# Patient Record
Sex: Female | Born: 1980 | Race: White | Hispanic: No | Marital: Married | Smoking: Current every day smoker
Health system: Southern US, Community
[De-identification: ages and names within clinical notes are randomized; demographics above are authoritative.]

## PROBLEM LIST (undated history)

## (undated) DIAGNOSIS — I471 Supraventricular tachycardia, unspecified: Secondary | ICD-10-CM

## (undated) DIAGNOSIS — R109 Unspecified abdominal pain: Secondary | ICD-10-CM

## (undated) DIAGNOSIS — Z87442 Personal history of urinary calculi: Secondary | ICD-10-CM

## (undated) DIAGNOSIS — N289 Disorder of kidney and ureter, unspecified: Secondary | ICD-10-CM

## (undated) DIAGNOSIS — N200 Calculus of kidney: Secondary | ICD-10-CM

## (undated) HISTORY — PX: OTHER SURGICAL HISTORY: SHX169

---

## 2008-06-24 ENCOUNTER — Emergency Department: Payer: Self-pay | Admitting: Emergency Medicine

## 2008-08-10 ENCOUNTER — Ambulatory Visit (HOSPITAL_COMMUNITY): Admission: RE | Admit: 2008-08-10 | Discharge: 2008-08-10 | Payer: Self-pay | Admitting: Obstetrics & Gynecology

## 2008-08-10 ENCOUNTER — Ambulatory Visit: Payer: Self-pay | Admitting: Family Medicine

## 2008-08-10 LAB — CONVERTED CEMR LAB
Antibody Screen: NEGATIVE
Basophils Relative: 0 % (ref 0–1)
Eosinophils Absolute: 0.1 10*3/uL (ref 0.0–0.7)
Hemoglobin: 13 g/dL (ref 12.0–15.0)
Lymphocytes Relative: 20 % (ref 12–46)
Lymphs Abs: 1.6 10*3/uL (ref 0.7–4.0)
Neutro Abs: 5.8 10*3/uL (ref 1.7–7.7)
Platelets: 250 10*3/uL (ref 150–400)
RBC: 4.54 M/uL (ref 3.87–5.11)
Rh Type: NEGATIVE
Rubella: >500 intl units/mL — ABNORMAL HIGH

## 2008-08-24 ENCOUNTER — Ambulatory Visit: Payer: Self-pay | Admitting: Obstetrics & Gynecology

## 2008-08-24 ENCOUNTER — Encounter: Payer: Self-pay | Admitting: Family Medicine

## 2008-08-26 ENCOUNTER — Ambulatory Visit: Payer: Self-pay | Admitting: Family Medicine

## 2008-08-26 LAB — CONVERTED CEMR LAB: hCG, Beta Chain, Quant, S: 173.7 milliintl units/mL

## 2008-09-30 ENCOUNTER — Ambulatory Visit: Payer: Self-pay | Admitting: Obstetrics & Gynecology

## 2008-10-01 ENCOUNTER — Encounter: Payer: Self-pay | Admitting: Family Medicine

## 2009-01-25 ENCOUNTER — Ambulatory Visit (HOSPITAL_COMMUNITY): Admission: RE | Admit: 2009-01-25 | Discharge: 2009-01-25 | Payer: Self-pay | Admitting: Urology

## 2009-02-16 ENCOUNTER — Ambulatory Visit (HOSPITAL_COMMUNITY): Admission: RE | Admit: 2009-02-16 | Discharge: 2009-02-16 | Payer: Self-pay | Admitting: Urology

## 2009-04-06 ENCOUNTER — Emergency Department: Payer: Self-pay | Admitting: Internal Medicine

## 2009-05-01 HISTORY — PX: EXTRACORPOREAL SHOCK WAVE LITHOTRIPSY: SHX1557

## 2009-06-10 ENCOUNTER — Emergency Department: Payer: Self-pay | Admitting: Internal Medicine

## 2009-06-21 ENCOUNTER — Emergency Department (HOSPITAL_COMMUNITY): Admission: EM | Admit: 2009-06-21 | Discharge: 2009-06-21 | Payer: Self-pay | Admitting: Emergency Medicine

## 2009-07-14 ENCOUNTER — Emergency Department (HOSPITAL_COMMUNITY): Admission: EM | Admit: 2009-07-14 | Discharge: 2009-07-14 | Payer: Self-pay | Admitting: Emergency Medicine

## 2009-07-16 ENCOUNTER — Emergency Department: Payer: Self-pay | Admitting: Emergency Medicine

## 2009-11-29 ENCOUNTER — Emergency Department: Payer: Self-pay | Admitting: Emergency Medicine

## 2010-01-02 ENCOUNTER — Emergency Department (HOSPITAL_COMMUNITY): Admission: EM | Admit: 2010-01-02 | Discharge: 2010-01-02 | Payer: Self-pay | Admitting: Emergency Medicine

## 2010-07-14 LAB — BASIC METABOLIC PANEL WITH GFR
BUN: 7 mg/dL (ref 6–23)
Creatinine, Ser: 0.68 mg/dL (ref 0.4–1.2)
GFR calc non Af Amer: >60 mL/min (ref >60)

## 2010-07-14 LAB — BASIC METABOLIC PANEL
CO2: 26 mEq/L (ref 19–32)
Calcium: 8.9 mg/dL (ref 8.4–10.5)
Chloride: 103 mEq/L (ref 96–112)
GFR calc Af Amer: >60 mL/min (ref >60)
Glucose, Bld: 81 mg/dL (ref 70–99)
Potassium: 3.7 mEq/L (ref 3.5–5.1)
Sodium: 137 mEq/L (ref 135–145)

## 2010-07-14 LAB — CBC
HCT: 40.5 % (ref 36.0–46.0)
Hemoglobin: 14 g/dL (ref 12.0–15.0)
MCH: 29.4 pg (ref 26.0–34.0)
MCHC: 34.5 g/dL (ref 30.0–36.0)
MCV: 85.4 fL (ref 78.0–100.0)
Platelets: 161 10*3/uL (ref 150–400)
RBC: 4.75 MIL/uL (ref 3.87–5.11)
RDW: 12.6 % (ref 11.5–15.5)
WBC: 8.9 K/uL (ref 4.0–10.5)

## 2010-07-14 LAB — URINALYSIS, ROUTINE W REFLEX MICROSCOPIC
Glucose, UA: NEGATIVE mg/dL
Hgb urine dipstick: NEGATIVE
Ketones, ur: 15 mg/dL — AB
Nitrite: NEGATIVE
Protein, ur: NEGATIVE mg/dL
Specific Gravity, Urine: >1.03 — ABNORMAL HIGH (ref 1.005–1.030)
Urobilinogen, UA: >8 mg/dL — ABNORMAL HIGH (ref 0.0–1.0)
pH: 6 (ref 5.0–8.0)

## 2010-07-14 LAB — PREGNANCY, URINE: Preg Test, Ur: NEGATIVE

## 2010-07-14 LAB — DIFFERENTIAL
Basophils Absolute: 0 K/uL (ref 0.0–0.1)
Basophils Relative: 0 % (ref 0–1)
Eosinophils Absolute: 0 10*3/uL (ref 0.0–0.7)
Eosinophils Relative: 0 % (ref 0–5)
Lymphocytes Relative: 10 % — ABNORMAL LOW (ref 12–46)
Lymphs Abs: 0.9 10*3/uL (ref 0.7–4.0)
Monocytes Absolute: 0.8 K/uL (ref 0.1–1.0)
Monocytes Relative: 9 % (ref 3–12)
Neutro Abs: 7.2 K/uL (ref 1.7–7.7)
Neutrophils Relative %: 80 % — ABNORMAL HIGH (ref 43–77)

## 2010-07-14 LAB — RAPID STREP SCREEN (MED CTR MEBANE ONLY): Streptococcus, Group A Screen (Direct): NEGATIVE

## 2010-07-20 LAB — DIFFERENTIAL
Basophils Absolute: 0.1 10*3/uL (ref 0.0–0.1)
Eosinophils Absolute: 0.1 10*3/uL (ref 0.0–0.7)
Eosinophils Relative: 2 % (ref 0–5)
Lymphocytes Relative: 36 % (ref 12–46)
Monocytes Absolute: 0.5 10*3/uL (ref 0.1–1.0)
Neutro Abs: 4.9 10*3/uL (ref 1.7–7.7)
Neutrophils Relative %: 56 % (ref 43–77)

## 2010-07-20 LAB — BASIC METABOLIC PANEL
Chloride: 101 mEq/L (ref 96–112)
GFR calc non Af Amer: >60 mL/min (ref >60)
Potassium: 3 mEq/L — ABNORMAL LOW (ref 3.5–5.1)
Sodium: 133 mEq/L — ABNORMAL LOW (ref 135–145)

## 2010-07-20 LAB — POCT CARDIAC MARKERS
CKMB, poc: <1 ng/mL — ABNORMAL LOW (ref 1.0–8.0)
Troponin i, poc: <0.05 ng/mL (ref 0.00–0.09)

## 2010-07-20 LAB — D-DIMER, QUANTITATIVE: D-Dimer, Quant: 0.25 ug/mL-FEU (ref 0.00–0.48)

## 2010-07-20 LAB — CBC
HCT: 39.9 % (ref 36.0–46.0)
Hemoglobin: 13.7 g/dL (ref 12.0–15.0)
MCHC: 34.3 g/dL (ref 30.0–36.0)
MCV: 85.5 fL (ref 78.0–100.0)
Platelets: 234 10*3/uL (ref 150–400)

## 2010-07-25 LAB — URINALYSIS, ROUTINE W REFLEX MICROSCOPIC
Protein, ur: 30 mg/dL — AB
Specific Gravity, Urine: 1.015 (ref 1.005–1.030)
pH: 8 (ref 5.0–8.0)

## 2010-07-25 LAB — URINE CULTURE: Colony Count: NO GROWTH

## 2010-07-25 LAB — PREGNANCY, URINE: Preg Test, Ur: NEGATIVE

## 2010-09-13 NOTE — Assessment & Plan Note (Signed)
Jeanne David, BERKERY NO.:  [id number]   MEDICAL RECORD NO.:  [id number]          PATIENT TYPE:  POB   LOCATION:  [facility] at [facility]         FACILITY:  [facility]   PHYSICIAN:  Scheryl Darter, MD       DATE OF BIRTH:  [birth year]   DATE OF SERVICE:                                  CLINIC NOTE   The patient returns in followup after having a elective abortion on  April 15.  She received RhoGAM due to large negative status.  The  patient is a 30 year old gravida 3, para 2, abortus 1, last menstrual  period May 28.  For nearly 2 weeks, she has had symptoms of urinary  frequency and pressure.  She states she wants to conceive.  She has a  sample for Loestrin 24 which was given to her after her procedure.  She  breakthrough bleeding on the current pack.  Advised her that she wants  to conceive and then she should not take the birth control pills.  I  have recommended she take a prenatal vitamin or an adult multivitamin.  Urinalysis is consistent with UTI.  Gave prescription for Bactrim DS 10  tablets 1 p.o. b.i.d.  She will report for symptoms that improve.  Recommend that she return for routine gynecologic exam in 3-4 months.  She will notify us if she conceives however.      Scheryl Darter, MD     JA/MEDQ  D:  09/30/2008  T:  09/30/2008  Job:  [id number]

## 2011-02-27 ENCOUNTER — Emergency Department: Payer: Self-pay | Admitting: Emergency Medicine

## 2014-05-01 DIAGNOSIS — I471 Supraventricular tachycardia: Secondary | ICD-10-CM

## 2014-05-01 HISTORY — DX: Supraventricular tachycardia: I47.1

## 2014-07-20 ENCOUNTER — Emergency Department: Payer: Self-pay | Admitting: Emergency Medicine

## 2016-08-19 ENCOUNTER — Other Ambulatory Visit: Payer: Self-pay

## 2016-08-19 ENCOUNTER — Emergency Department: Payer: Self-pay

## 2016-08-19 ENCOUNTER — Emergency Department
Admission: EM | Admit: 2016-08-19 | Discharge: 2016-08-19 | Disposition: A | Discharge status: Home / Self Care | Payer: Self-pay | Attending: Emergency Medicine | Admitting: Emergency Medicine

## 2016-08-19 ENCOUNTER — Encounter: Payer: Self-pay | Admitting: Emergency Medicine

## 2016-08-19 DIAGNOSIS — R079 Chest pain, unspecified: Secondary | ICD-10-CM

## 2016-08-19 DIAGNOSIS — I471 Supraventricular tachycardia: Secondary | ICD-10-CM | POA: Insufficient documentation

## 2016-08-19 DIAGNOSIS — F17200 Nicotine dependence, unspecified, uncomplicated: Secondary | ICD-10-CM | POA: Insufficient documentation

## 2016-08-19 LAB — CBC WITH DIFFERENTIAL/PLATELET
Basophils Absolute: 0.1 10*3/uL (ref 0–0.1)
Basophils Relative: 1 %
Eosinophils Absolute: 0.4 10*3/uL (ref 0–0.7)
Eosinophils Relative: 4 %
HEMATOCRIT: 45.3 % (ref 35.0–47.0)
HEMOGLOBIN: 15.5 g/dL (ref 12.0–16.0)
LYMPHS ABS: 3.6 10*3/uL (ref 1.0–3.6)
LYMPHS PCT: 34 %
MCH: 29 pg (ref 26.0–34.0)
MCHC: 34.3 g/dL (ref 32.0–36.0)
MCV: 84.6 fL (ref 80.0–100.0)
MONO ABS: 0.6 10*3/uL (ref 0.2–0.9)
Monocytes Relative: 6 %
NEUTROS ABS: 6 10*3/uL (ref 1.4–6.5)
NEUTROS PCT: 55 %
Platelets: 341 10*3/uL (ref 150–440)
RBC: 5.36 MIL/uL — ABNORMAL HIGH (ref 3.80–5.20)
RDW: 12.5 % (ref 11.5–14.5)
WBC: 10.7 10*3/uL (ref 3.6–11.0)

## 2016-08-19 LAB — BASIC METABOLIC PANEL
Anion gap: 8 (ref 5–15)
BUN: 9 mg/dL (ref 6–20)
CHLORIDE: 109 mmol/L (ref 101–111)
CO2: 25 mmol/L (ref 22–32)
CREATININE: 0.64 mg/dL (ref 0.44–1.00)
Calcium: 8.8 mg/dL — ABNORMAL LOW (ref 8.9–10.3)
GFR calc non Af Amer: >60 mL/min (ref >60)
GLUCOSE: 111 mg/dL — AB (ref 65–99)
Potassium: 3.3 mmol/L — ABNORMAL LOW (ref 3.5–5.1)
Sodium: 142 mmol/L (ref 135–145)

## 2016-08-19 LAB — TROPONIN I: Troponin I: <0.03 ng/mL (ref <0.03)

## 2016-08-19 LAB — ETHANOL: Alcohol, Ethyl (B): 160 mg/dL — ABNORMAL HIGH (ref <5)

## 2016-08-19 MED ORDER — ADENOSINE 6 MG/2ML IV SOLN
6.0000 mg | Freq: Once | INTRAVENOUS | Status: AC
  Administered 2016-08-19: 6 mg via INTRAVENOUS

## 2016-08-19 MED ORDER — DILTIAZEM HCL 100 MG IV SOLR
5.0000 mg/h | Freq: Once | INTRAVENOUS | Status: AC
  Administered 2016-08-19: 5 mg/h via INTRAVENOUS
  Filled 2016-08-19: qty 100

## 2016-08-19 MED ORDER — DILTIAZEM HCL 25 MG/5ML IV SOLN
10.0000 mg | Freq: Once | INTRAVENOUS | Status: AC
  Administered 2016-08-19: 10 mg via INTRAVENOUS

## 2016-08-19 MED ORDER — ADENOSINE 12 MG/4ML IV SOLN
12.0000 mg | Freq: Once | INTRAVENOUS | Status: AC
  Administered 2016-08-19: 12 mg via INTRAVENOUS

## 2016-08-19 MED ORDER — ONDANSETRON HCL 4 MG/2ML IJ SOLN
INTRAMUSCULAR | Status: AC
  Filled 2016-08-19: qty 2

## 2016-08-19 MED ORDER — SODIUM CHLORIDE 0.9 % IV BOLUS (SEPSIS)
1000.0000 mL | Freq: Once | INTRAVENOUS | Status: AC
  Administered 2016-08-19: 1000 mL via INTRAVENOUS

## 2016-08-19 NOTE — ED Notes (Signed)
Pt assisted up to commode. Pt states "I just want to go home, I mean that's a risk I have to take." pt then states "Im gonna give it another hour but this medicine is not working."

## 2016-08-19 NOTE — ED Notes (Addendum)
Pt converted HR 101. MD Manson Passey at bedside

## 2016-08-19 NOTE — ED Notes (Signed)
Pt offered wheelchair upon registration. Pt refused chair. MD Manson Passey aware.

## 2016-08-19 NOTE — ED Notes (Signed)
Dr. Manson Passey in to speak with pt regarding continued tachycardia treatment.

## 2016-08-19 NOTE — ED Notes (Signed)
Pt throwing up, HR incr to 190.  MD Manson Passey informed.  4 mg Zofran ordered.  Pt refused zofran.  Pts HR decr 106

## 2016-08-19 NOTE — ED Notes (Signed)
Pt assisted up to void. Pt declines offer for lights out and head of bed reclined. Additional warm blankets provided.

## 2016-08-19 NOTE — ED Notes (Signed)
Report to nellie, rn for lunch relief.

## 2016-08-19 NOTE — ED Notes (Signed)
Dr. Manson Passey in to speak with pt again.

## 2016-08-19 NOTE — ED Notes (Signed)
Pt states tachycardia that began at 1900 yesterday accompanied by left neck pain and chest pain. etoh odor noted. Pt states she tried to bear down and control rate at home without success. Pt is very anxious and irritated at questions asked by md. Pt repeatedly states to md "that's not gonna work" with each treatment discussed. Skin pwd.

## 2016-08-19 NOTE — ED Triage Notes (Signed)
Pt arrives ambulatory to triage with c/o SVT. Pt taken to room 5. Pt has hx of SVT.

## 2016-08-19 NOTE — ED Provider Notes (Signed)
[facility] Emergency Department Provider Note    First MD Initiated Contact with Patient 08/19/16 0141     (approximate)  I have reviewed the triage vital signs and the nursing notes.   HISTORY  Chief Complaint Tachycardia    HPI Jeanne David is a 36 y.o. female  with history of SVT presents to the emergency department stating that she's been in SVT since 7 PM. Patient denies any pain at this time but does admit to intermittent dizziness. Patient denies any nausea vomiting. Patient denies any dyspnea. Patient states that she's had episodes of SVT since childhood that she has tried multiple maneuvers since 7:00 and an attempt to stop the arrhythmia however has been unable to do so. Patient states that she was referred to cardiology however due to lack of radical insurance has been unable to have any intervention performed. Patient states that she has episodes of SVT daily over most episodes of brief.  Past medical history Superventricular tachycardia There are no active problems to display for this patient.   Past surgical history None  Prior to Admission medications   Not on File    Allergies No known drug allergies No family history on file.  Social History Social History  Substance Use Topics  . Smoking status: Current Every Day Smoker  . Smokeless tobacco: Never Used  . Alcohol use No    Review of Systems Constitutional: No fever/chills Eyes: No visual changes. ENT: No sore throat. Cardiovascular: Denies chest pain.Positive for rapid heart palpitations. Respiratory: Denies shortness of breath. Gastrointestinal: No abdominal pain.  No nausea, no vomiting.  No diarrhea.  No constipation. Genitourinary: Negative for dysuria. Musculoskeletal: Negative for back pain. Skin: Negative for rash. Neurological: Negative for headaches, focal weakness or numbness.  10-point ROS otherwise  negative.  ____________________________________________   PHYSICAL EXAM:  VITAL SIGNS: ED Triage Vitals  Enc Vitals Group     BP 08/19/16 0122 (!) 136/92     Pulse Rate 08/19/16 0122 (!) 211     Resp 08/19/16 0122 (!) 24     Temp --      Temp src --      SpO2 08/19/16 0122 100 %     Weight 08/19/16 0119 130 lb (59 kg)     Height 08/19/16 0119  (1.6 m)     Head Circumference --      Peak Flow --      Pain Score 08/19/16 0119 0     Pain Loc --      Pain Edu? --      Excl. in GC? --     Constitutional: Alert and oriented. Appears to be irate. EtOH on breath Eyes: Conjunctivae are normal. PERRL. EOMI. Head: Atraumatic. Mouth/Throat: Mucous membranes are moist. Oropharynx non-erythematous. Neck: No stridor.   Cardiovascular: Tachycardia, regular rhythm. Good peripheral circulation. Grossly normal heart sounds. Respiratory: Normal respiratory effort.  No retractions. Lungs CTAB. Gastrointestinal: Soft and nontender. No distention.  Musculoskeletal: No lower extremity tenderness nor edema. No gross deformities of extremities. Neurologic:  Normal speech and language. No gross focal neurologic deficits are appreciated.  Skin:  Skin is warm, dry and intact. No rash noted. Psychiatric: Mood and affect are normal. Speech and behavior are normal.  ____________________________________________   LABS (all labs ordered are listed, but only abnormal results are displayed)  Labs Reviewed  CBC WITH DIFFERENTIAL/PLATELET - Abnormal; Notable for the following:       Result Value   RBC  5.36 (*)    All other components within normal limits  BASIC METABOLIC PANEL - Abnormal; Notable for the following:    Potassium 3.3 (*)    Glucose, Bld 111 (*)    Calcium 8.8 (*)    All other components within normal limits  ETHANOL - Abnormal; Notable for the following:    Alcohol, Ethyl (B) 160 (*)    All other components within normal limits  TROPONIN I    ____________________________________________  EKG   ED ECG REPORT I, [place] N BROWN, the attending physician, personally viewed and interpreted this ECG.   Date: 08/19/2016  EKG Time: 1:16 AM  Rate: 202  Rhythm: Supraventricular tachycardia  Axis: Normal  Intervals: Normal  ST&T Change: None  ____________________________________________  RADIOLOGY I, [place] N BROWN, personally viewed and evaluated these images (plain radiographs) as part of my medical decision making, as well as reviewing the written report by the radiologist.  Dg Chest Port 1 View  Result Date: 08/19/2016 CLINICAL DATA:  Chest pain this morning. EXAM: PORTABLE CHEST 1 VIEW COMPARISON:  07/20/2014 FINDINGS: The heart size and mediastinal contours are within normal limits. Both lungs are clear. The visualized skeletal structures are unremarkable. IMPRESSION: No active disease. Electronically Signed   By: Burman Nieves M.D.   On: 08/19/2016 02:13    ____________________________________________   PROCEDURES  Critical Care performed: CRITICAL CARE Performed by: Darci Current   Total critical care time: 90 minutes  Critical care time was exclusive of separately billable procedures and treating other patients.  Critical care was necessary to treat or prevent imminent or life-threatening deterioration.  Critical care was time spent personally by me on the following activities: development of treatment plan with patient and/or surrogate as well as nursing, discussions with consultants, evaluation of patient's response to treatment, examination of patient, obtaining history from patient or surrogate, ordering and performing treatments and interventions, ordering and review of laboratory studies, ordering and review of radiographic studies, pulse oximetry and re-evaluation of patient's condition.     Procedures   ____________________________________________   INITIAL IMPRESSION / ASSESSMENT AND  PLAN / ED COURSE  Pertinent labs & imaging results that were available during my care of the patient were reviewed by me and considered in my medical decision making (see chart for details).  36 year old female presenting in suprapubic ventricular tachycardia. Patient irate on arrival to the room but informed me that this was secondary to frustration secondary to her medical illness. Patient told me on arrival that she will not allow herself to be admitted to the hospital. Patient also informed me that adenosine has never worked for her in the past. I spoke with the patient at length regarding management of SVT.  Patient received 6 mg and subsequently 12 mg of adenosine with brief resolution of SVT but then spontaneous return back to SVT. As such patient given Cardizem 10 mg bolus again with brief interruption of SVT. Patient placed on a Cardizem drip. Patient adamantly refusing to be admitted. As such patient maintained in the emergency department.  Following receiving the patient's alcohol level she admits to drinking EtOH daily.  Patient's SVT resolved at at 4:00 AM. Patient requesting discharge at this time   Patient referred to Dr. Elberta Fortis (electrophysiologist cardiology) ____________________________________________  FINAL CLINICAL IMPRESSION(S) / ED DIAGNOSES  Final diagnoses:  SVT (supraventricular tachycardia) (HCC)     MEDICATIONS GIVEN DURING THIS VISIT:  Medications  adenosine (ADENOCARD) 6 MG/2ML injection 6 mg (6 mg Intravenous Given 08/19/16 0124)  adenosine (ADENOCARD) 12 MG/4ML injection 12 mg (12 mg Intravenous Given 08/19/16 0129)  diltiazem (CARDIZEM) injection 10 mg (10 mg Intravenous Given 08/19/16 0135)  diltiazem (CARDIZEM) 100 mg in dextrose 5 % 100 mL (1 mg/mL) infusion (15 mg/hr Intravenous Rate/Dose Change 08/19/16 0335)  adenosine (ADENOCARD) 6 MG/2ML injection 6 mg (6 mg Intravenous Given 08/19/16 0124)  adenosine (ADENOCARD) 12 MG/4ML injection 12 mg (12 mg  Intravenous Given 08/19/16 0130)  sodium chloride 0.9 % bolus 1,000 mL (0 mLs Intravenous Stopped 08/19/16 0225)  sodium chloride 0.9 % bolus 1,000 mL (0 mLs Intravenous Stopped 08/19/16 0232)     NEW OUTPATIENT MEDICATIONS STARTED DURING THIS VISIT:  New Prescriptions   No medications on file    Modified Medications   No medications on file    Discontinued Medications   No medications on file     Note:  This document was prepared using Dragon voice recognition software and may include unintentional dictation errors.    Darci Current, MD 08/19/16 [phone]

## 2017-09-26 ENCOUNTER — Telehealth: Payer: Self-pay | Admitting: Licensed Clinical Social Worker

## 2017-09-26 NOTE — Telephone Encounter (Signed)
Dhanvi Boesen/Counselor called home and voicemail was full-unable to leave a message. Also, called mobile phone which rang busy. This is a follow up call regarding the referral by Dr. Oswaldo Done: Worsening anxiety and mood problems over the last few months.  Started on Lexapro in the past.  Increase to 20 mg today.

## 2018-04-22 ENCOUNTER — Other Ambulatory Visit: Payer: Self-pay

## 2018-04-22 ENCOUNTER — Emergency Department
Admission: EM | Admit: 2018-04-22 | Discharge: 2018-04-22 | Disposition: A | Discharge status: Home / Self Care | Payer: Self-pay | Attending: Emergency Medicine | Admitting: Emergency Medicine

## 2018-04-22 DIAGNOSIS — R112 Nausea with vomiting, unspecified: Secondary | ICD-10-CM | POA: Insufficient documentation

## 2018-04-22 DIAGNOSIS — M7918 Myalgia, other site: Secondary | ICD-10-CM | POA: Insufficient documentation

## 2018-04-22 DIAGNOSIS — I471 Supraventricular tachycardia: Secondary | ICD-10-CM | POA: Insufficient documentation

## 2018-04-22 DIAGNOSIS — J111 Influenza due to unidentified influenza virus with other respiratory manifestations: Secondary | ICD-10-CM | POA: Insufficient documentation

## 2018-04-22 DIAGNOSIS — F17200 Nicotine dependence, unspecified, uncomplicated: Secondary | ICD-10-CM | POA: Insufficient documentation

## 2018-04-22 HISTORY — DX: Supraventricular tachycardia: I47.1

## 2018-04-22 HISTORY — DX: Supraventricular tachycardia, unspecified: I47.10

## 2018-04-22 LAB — INFLUENZA PANEL BY PCR (TYPE A & B)
INFLBPCR: NEGATIVE
Influenza A By PCR: POSITIVE — AB

## 2018-04-22 MED ORDER — ONDANSETRON 4 MG PO TBDP
ORAL_TABLET | ORAL | Status: AC
  Filled 2018-04-22: qty 1

## 2018-04-22 MED ORDER — OSELTAMIVIR PHOSPHATE 75 MG PO CAPS: 75.0000 mg | ORAL_CAPSULE | Freq: Two times a day (BID) | ORAL | 0 refills | Status: AC

## 2018-04-22 MED ORDER — ONDANSETRON 4 MG PO TBDP: 4.0000 mg | ORAL_TABLET | Freq: Once | ORAL | Status: DC

## 2018-04-22 MED ORDER — ONDANSETRON HCL 4 MG PO TABS: 4.0000 mg | ORAL_TABLET | Freq: Once | ORAL | Status: DC

## 2018-04-22 MED ORDER — SODIUM CHLORIDE 0.9 % IV BOLUS: 1000.0000 mL | Freq: Once | INTRAVENOUS | Status: DC

## 2018-04-22 MED ORDER — METOPROLOL TARTRATE 25 MG PO TABS: 25.0000 mg | ORAL_TABLET | Freq: Two times a day (BID) | ORAL | 0 refills | Status: DC

## 2018-04-22 MED ORDER — ONDANSETRON HCL 4 MG/2ML IJ SOLN: 4.0000 mg | Freq: Once | INTRAMUSCULAR | Status: DC

## 2018-04-22 MED ORDER — ATENOLOL 25 MG PO TABS
25.0000 mg | ORAL_TABLET | Freq: Once | ORAL | Status: AC
  Administered 2018-04-22: 25 mg via ORAL
  Filled 2018-04-22: qty 1

## 2018-04-22 MED ORDER — OSELTAMIVIR PHOSPHATE 75 MG PO CAPS
75.0000 mg | ORAL_CAPSULE | Freq: Once | ORAL | Status: AC
  Administered 2018-04-22: 75 mg via ORAL
  Filled 2018-04-22: qty 1

## 2018-04-22 NOTE — ED Provider Notes (Signed)
[facility] Emergency Department Provider Note  ___________________________________________   First MD Initiated Contact with Patient 04/22/18 1003     (approximate)  I have reviewed the triage vital signs and the nursing notes.   HISTORY  Chief Complaint Emesis and Fever   HPI Jeanne David is a 37 y.o. female with a history of SVT off of her atenolol who is presenting with 24 hours of fever, body aches as well as cough, nausea and vomiting.  Denies any diarrhea.  Denies any known sick contacts.  Says that she has been off of her atenolol because she does not have a primary care doctor and has no one to refill her medications.  Says she thinks she had an episode of SVT earlier this morning while vomiting but it is since resolved.   Past Medical History:  Diagnosis Date  . SVT (supraventricular tachycardia) (HCC)     There are no active problems to display for this patient.   History reviewed. No pertinent surgical history.  Prior to Admission medications   Not on File    Allergies Patient has no known allergies.  No family history on file.  Social History Social History   Tobacco Use  . Smoking status: Current Every Day Smoker  . Smokeless tobacco: Never Used  Substance Use Topics  . Alcohol use: No  . Drug use: No    Review of Systems  Constitutional: Fever/chills Eyes: No visual changes. ENT: Rhinorrhea Cardiovascular: Denies chest pain. Respiratory: For cough Gastrointestinal: No abdominal pain.   No diarrhea.  No constipation. Genitourinary: Negative for dysuria. Musculoskeletal: Negative for back pain. Skin: Negative for rash. Neurological: Negative for headaches, focal weakness or numbness.   ____________________________________________   PHYSICAL EXAM:  VITAL SIGNS: ED Triage Vitals  Enc Vitals Group     BP 04/22/18 1004 106/74     Pulse Rate 04/22/18 1004 99     Resp 04/22/18 1004 18     Temp 04/22/18  1004 100 F (37.8 C)     Temp Source 04/22/18 1004 Oral     SpO2 04/22/18 1004 99 %     Weight 04/22/18 0958 145 lb (65.8 kg)     Height 04/22/18 0958 5\' 2"  (1.575 m)     Head Circumference --      Peak Flow --      Pain Score 04/22/18 0958 7     Pain Loc --      Pain Edu? --      Excl. in GC? --     Constitutional: Alert and oriented.  Appears ill.  Coughing in the room. Eyes: Conjunctivae are normal.  Head: Atraumatic. Nose: Clear rhinorrhea to the bilateral naris Mouth/Throat: Mucous membranes are moist.  No erythema.  No swelling or exudate. Neck: No stridor.   Cardiovascular: Normal rate, regular rhythm. Grossly normal heart sounds.  With deep breathing the patient's heart rate goes down to 98 bpm.  Patient had a vomiting episode and the heart rate went up to approximate 180 but resolved back to the 90s after several minutes. Respiratory: Normal respiratory effort.  No retractions. Lungs CTAB. Gastrointestinal: Soft and nontender. No distention.  Musculoskeletal: No lower extremity tenderness nor edema.  No joint effusions. Neurologic:  Normal speech and language. No gross focal neurologic deficits are appreciated. Skin:  Skin is warm, dry and intact. No rash noted. Psychiatric: Mood and affect are normal. Speech and behavior are normal.  ____________________________________________   LABS (all labs ordered  are listed, but only abnormal results are displayed)  Labs Reviewed  INFLUENZA PANEL BY PCR (TYPE A & B) - Abnormal; Notable for the following components:      Result Value   Influenza A By PCR POSITIVE (*)    All other components within normal limits   ____________________________________________  EKG    ____________________________________________  RADIOLOGY   ____________________________________________   PROCEDURES  Procedure(s) performed:   Procedures  Critical Care performed:   ____________________________________________   INITIAL  IMPRESSION / ASSESSMENT AND PLAN / ED COURSE  Pertinent labs & imaging results that were available during my care of the patient were reviewed by me and considered in my medical decision making (see chart for details).  DDX: URI, influenza, SVT As part of my medical decision making, I reviewed the following data within the electronic MEDICAL RECORD NUMBER Notes from prior ED visits  ----------------------------------------- 10:58 AM on 04/22/2018 -----------------------------------------  Patient positive for flu A.  She will be discharged with prescriptions for Tamiflu, Zofran as well as atenolol.  Will be given her first dose of each of these medications here in the emergency department.  Will be discharged home.  Given work note as well.  ____________________________________________   FINAL CLINICAL IMPRESSION(S) / ED DIAGNOSES  Influenza.  SVT.  NEW MEDICATIONS STARTED DURING THIS VISIT:  New Prescriptions   No medications on file     Note:  This document was prepared using Dragon voice recognition software and may include unintentional dictation errors.     Myrna BlazerSchaevitz, Detravion Tester Matthew, MD 04/22/18 1059

## 2018-04-22 NOTE — ED Triage Notes (Signed)
Pt c/o bodyaches with temp 102 this morning, N/V.. states she started with a cough yesterday today having the N/V. Denies abd pain or diarrhea.

## 2018-11-14 ENCOUNTER — Other Ambulatory Visit: Payer: Self-pay

## 2018-11-14 ENCOUNTER — Emergency Department
Admission: EM | Admit: 2018-11-14 | Discharge: 2018-11-14 | Disposition: A | Discharge status: Home / Self Care | Payer: Self-pay | Attending: Emergency Medicine | Admitting: Emergency Medicine

## 2018-11-14 ENCOUNTER — Emergency Department: Payer: Self-pay

## 2018-11-14 DIAGNOSIS — N23 Unspecified renal colic: Secondary | ICD-10-CM | POA: Insufficient documentation

## 2018-11-14 DIAGNOSIS — F17200 Nicotine dependence, unspecified, uncomplicated: Secondary | ICD-10-CM | POA: Insufficient documentation

## 2018-11-14 HISTORY — DX: Disorder of kidney and ureter, unspecified: N28.9

## 2018-11-14 LAB — URINALYSIS, COMPLETE (UACMP) WITH MICROSCOPIC
Bacteria, UA: NONE SEEN
Bilirubin Urine: NEGATIVE
Glucose, UA: NEGATIVE mg/dL
Ketones, ur: NEGATIVE mg/dL
Nitrite: NEGATIVE
Protein, ur: 30 mg/dL — AB
RBC / HPF: >50 RBC/hpf — ABNORMAL HIGH (ref 0–5)
Specific Gravity, Urine: 1.021 (ref 1.005–1.030)
pH: 5 (ref 5.0–8.0)

## 2018-11-14 LAB — CBC
HCT: 40.1 % (ref 36.0–46.0)
Hemoglobin: 13.1 g/dL (ref 12.0–15.0)
MCH: 27.9 pg (ref 26.0–34.0)
MCHC: 32.7 g/dL (ref 30.0–36.0)
MCV: 85.3 fL (ref 80.0–100.0)
Platelets: 336 10*3/uL (ref 150–400)
RBC: 4.7 MIL/uL (ref 3.87–5.11)
RDW: 12.5 % (ref 11.5–15.5)
WBC: 9.2 10*3/uL (ref 4.0–10.5)
nRBC: 0 % (ref 0.0–0.2)

## 2018-11-14 LAB — BASIC METABOLIC PANEL
Anion gap: 10 (ref 5–15)
BUN: 10 mg/dL (ref 6–20)
CO2: 23 mmol/L (ref 22–32)
Calcium: 9.1 mg/dL (ref 8.9–10.3)
Chloride: 108 mmol/L (ref 98–111)
Creatinine, Ser: 0.77 mg/dL (ref 0.44–1.00)
GFR calc Af Amer: >60 mL/min (ref >60)
GFR calc non Af Amer: >60 mL/min (ref >60)
Glucose, Bld: 116 mg/dL — ABNORMAL HIGH (ref 70–99)
Potassium: 3.9 mmol/L (ref 3.5–5.1)
Sodium: 141 mmol/L (ref 135–145)

## 2018-11-14 LAB — LIPASE, BLOOD: Lipase: 26 U/L (ref 11–51)

## 2018-11-14 LAB — POC URINE PREG, ED: Preg Test, Ur: NEGATIVE

## 2018-11-14 MED ORDER — KETOROLAC TROMETHAMINE 30 MG/ML IJ SOLN
30.0000 mg | Freq: Once | INTRAMUSCULAR | Status: AC
  Administered 2018-11-14: 08:00:00 30 mg via INTRAVENOUS
  Filled 2018-11-14: qty 1

## 2018-11-14 MED ORDER — ONDANSETRON 4 MG PO TBDP: 4.0000 mg | ORAL_TABLET | Freq: Three times a day (TID) | ORAL | 0 refills | Status: DC | PRN

## 2018-11-14 MED ORDER — MORPHINE SULFATE (PF) 4 MG/ML IV SOLN
4.0000 mg | Freq: Once | INTRAVENOUS | Status: AC
  Administered 2018-11-14: 4 mg via INTRAVENOUS
  Filled 2018-11-14: qty 1

## 2018-11-14 MED ORDER — TAMSULOSIN HCL 0.4 MG PO CAPS: 0.4000 mg | ORAL_CAPSULE | Freq: Every day | ORAL | 0 refills | Status: DC

## 2018-11-14 MED ORDER — ADENOSINE 12 MG/4ML IV SOLN: 12.0000 mg | Freq: Once | INTRAVENOUS | Status: DC

## 2018-11-14 MED ORDER — OXYCODONE-ACETAMINOPHEN 5-325 MG PO TABS: 1.0000 | ORAL_TABLET | Freq: Three times a day (TID) | ORAL | 0 refills | Status: DC | PRN

## 2018-11-14 MED ORDER — ONDANSETRON HCL 4 MG/2ML IJ SOLN
4.0000 mg | Freq: Once | INTRAMUSCULAR | Status: AC
  Administered 2018-11-14: 4 mg via INTRAVENOUS
  Filled 2018-11-14: qty 2

## 2018-11-14 NOTE — ED Notes (Addendum)
Pt ambulatory, A&OX4, verbalizes d./c understanding and RX given . NAD noted.

## 2018-11-14 NOTE — ED Triage Notes (Signed)
Pt c/o left flank pain , states she had a small pain on Sunday but today is severe with nausea. States she has a hx of kidney stones and this feels similar.

## 2018-11-14 NOTE — ED Provider Notes (Signed)
[facility] Emergency Department Provider Note       Time seen: ----------------------------------------- 8:00 AM on 11/14/2018 -----------------------------------------   I have reviewed the triage vital signs and the nursing notes.  HISTORY   Chief Complaint Flank Pain    HPI Jeanne David is a 38 y.o. female with a history of kidney stones and SVT who presents to the ED for left flank pain.  Patient complains of severe left flank pain that she noticed to a small degree on Sunday.  Pain was severe this morning with nausea.  Patient states it feels like when she had a kidney stone.  She also states she felt like she went into SVT on the way to the hospital.  She complains of nausea and vomiting but denies other complaints.  Past Medical History:  Diagnosis Date  . Renal disorder   . SVT (supraventricular tachycardia) (HCC)     There are no active problems to display for this patient.   History reviewed. No pertinent surgical history.  Allergies Patient has no known allergies.  Social History Social History   Tobacco Use  . Smoking status: Current Every Day Smoker  . Smokeless tobacco: Never Used  Substance Use Topics  . Alcohol use: No  . Drug use: No   Review of Systems Constitutional: Negative for fever. Cardiovascular: Negative for chest pain. Respiratory: Negative for shortness of breath. Gastrointestinal: Positive for flank pain, vomiting Musculoskeletal: Negative for back pain. Skin: Negative for rash. Neurological: Negative for headaches, focal weakness or numbness.  All systems negative/normal/unremarkable except as stated in the HPI  ____________________________________________   PHYSICAL EXAM:  VITAL SIGNS: ED Triage Vitals  Enc Vitals Group     BP 11/14/18 0720 (!) 135/91     Pulse Rate 11/14/18 0720 96     Resp 11/14/18 0720 18     Temp 11/14/18 0720 98.6 F (37 C)     Temp Source 11/14/18 0720 Oral   SpO2 11/14/18 0720 97 %     Weight 11/14/18 0721 143 lb (64.9 kg)     Height 11/14/18 0721 5\' 2"  (1.575 m)     Head Circumference --      Peak Flow --      Pain Score 11/14/18 0721 8     Pain Loc --      Pain Edu? --      Excl. in [place]? --    Constitutional: Alert and oriented.  Moderate distress from pain Eyes: Conjunctivae are normal. Normal extraocular movements. ENT      Head: Normocephalic and atraumatic.      Nose: No congestion/rhinnorhea.      Mouth/Throat: Mucous membranes are moist.      Neck: No stridor. Cardiovascular: Normal rate, regular rhythm. No murmurs, rubs, or gallops. Respiratory: Normal respiratory effort without tachypnea nor retractions. Breath sounds are clear and equal bilaterally. No wheezes/rales/rhonchi. Gastrointestinal: Left flank tenderness, no rebound or guarding.  Normal bowel sounds. Musculoskeletal: Nontender with normal range of motion in extremities. No lower extremity tenderness nor edema. Neurologic:  Normal speech and language. No gross focal neurologic deficits are appreciated.  Skin:  Skin is warm, dry and intact. No rash noted. Psychiatric: Mood and affect are normal. Speech and behavior are normal.  ____________________________________________  EKG: Interpreted by me.  Atrial tachycardia with a rate of 174 bpm, low voltage, normal axis, normal QT  Repeat EKG interpreted by me, sinus rhythm with borderline ST depression, normal axis, normal QT ____________________________________________  ED  COURSE:  As part of my medical decision making, I reviewed the following data within the electronic MEDICAL RECORD NUMBER History obtained from family if available, nursing notes, old chart and ekg, as well as notes from prior ED visits. Patient presented for left flank pain and was discovered to be in SVT., we will assess with labs and imaging as indicated at this time.   Procedures  Jeanne David was evaluated in Emergency Department on 11/14/2018  for the symptoms described in the history of present illness. She was evaluated in the context of the global COVID-19 pandemic, which necessitated consideration that the patient might be at risk for infection with the SARS-CoV-2 virus that causes COVID-19. Institutional protocols and algorithms that pertain to the evaluation of patients at risk for COVID-19 are in a state of rapid change based on information released by regulatory bodies including the CDC and federal and state organizations. These policies and algorithms were followed during the patient's care in the ED.  ____________________________________________   LABS (pertinent positives/negatives)  Labs Reviewed  URINALYSIS, COMPLETE (UACMP) WITH MICROSCOPIC - Abnormal; Notable for the following components:      Result Value   Color, Urine YELLOW (*)    APPearance HAZY (*)    Hgb urine dipstick LARGE (*)    Protein, ur 30 (*)    Leukocytes,Ua TRACE (*)    RBC / HPF >50 (*)    All other components within normal limits  BASIC METABOLIC PANEL - Abnormal; Notable for the following components:   Glucose, Bld 116 (*)    All other components within normal limits  CBC  LIPASE, BLOOD  POC URINE PREG, ED    RADIOLOGY Images were viewed by me  CT renal protocol IMPRESSION: There is a 2 mm calculus at the left ureterovesicular junction with mild left hydronephrosis and hydroureter. There are multiple additional nonobstructive bilateral renal calculi. ____________________________________________   DIFFERENTIAL DIAGNOSIS   Renal colic, UTI, pyelonephritis, arrhythmia, SVT  FINAL ASSESSMENT AND PLAN  Renal colic, SVT   Plan: The patient had presented for severe left flank pain and was discovered to be in SVT as well.  She spontaneously converted out of SVT.  Patient's labs were indicative of renal colic with hematuria but without other acute process. Patient's imaging did reveal a 2 mm distal left ureteral stone.  She is cleared for  outpatient follow-up.   Ulice DashJohnathan E Williams, MD    Note: This note was generated in part or whole with voice recognition software. Voice recognition is usually quite accurate but there are transcription errors that can and very often do occur. I apologize for any typographical errors that were not detected and corrected.     Emily FilbertWilliams, Jonathan E, MD 11/14/18 1023

## 2018-11-14 NOTE — ED Notes (Signed)
Patient transported to CT 

## 2018-11-14 NOTE — ED Notes (Signed)
Pt in SVT, states has hx of SVT. MD at bedside

## 2018-11-14 NOTE — ED Notes (Signed)
Gave pt urine cup with specimen bag.

## 2020-01-04 IMAGING — CT CT RENAL STONE PROTOCOL
3 of 4 series · 9 of 46 positions shown, 14 images · non-contrast
Comparison: None.

CLINICAL DATA: Flank pain, recurrent stone disease

EXAM:
CT ABDOMEN AND PELVIS WITHOUT CONTRAST
TECHNIQUE: Multidetector CT imaging of the abdomen and pelvis was performed
following the standard protocol without IV contrast.

[Series 4: lung bases · axial · 0.71mm/px · z∈[-505,-420]mm · 5 of 27 slices shown, 10 images]
[im 5/27  soft-tissue]
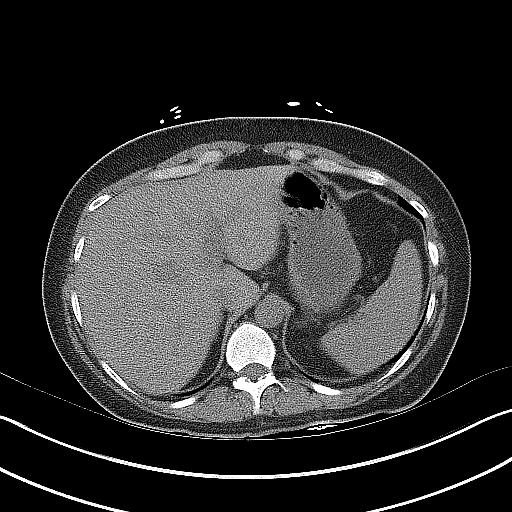
[im 5/27  bone]
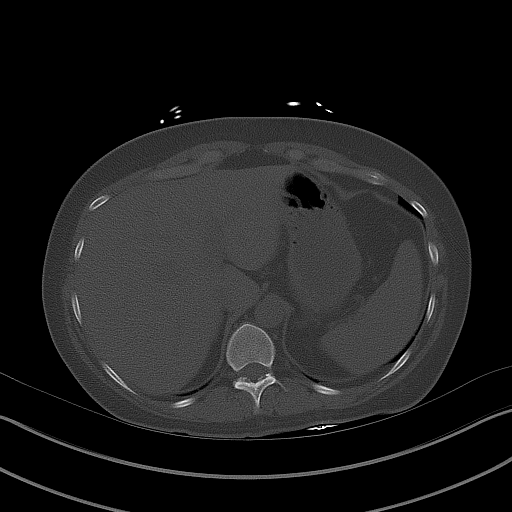
[im 9/27  soft-tissue]
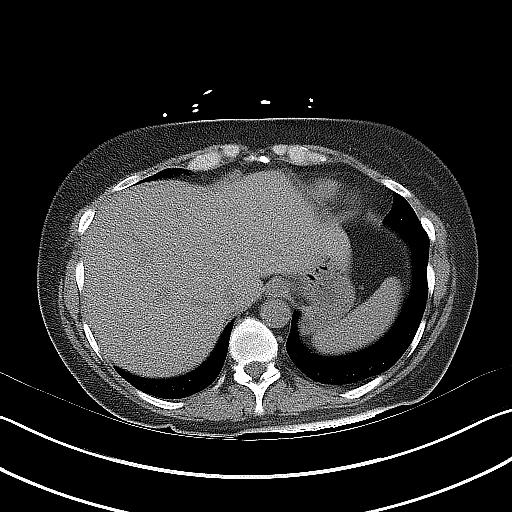
[im 9/27  lung]
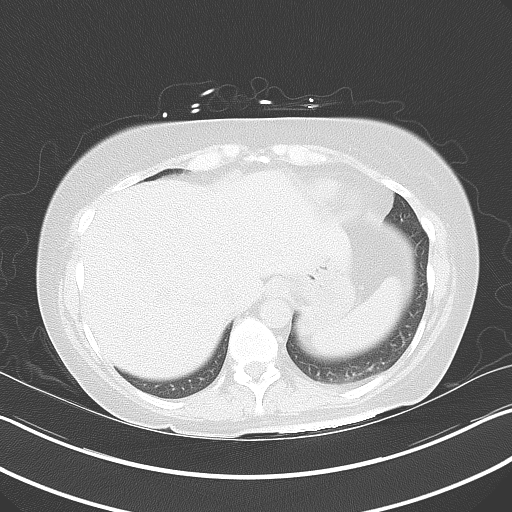
[im 14/27  soft-tissue]
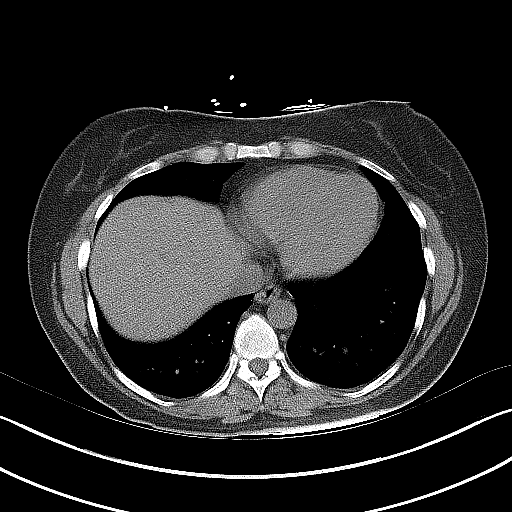
[im 14/27  lung]
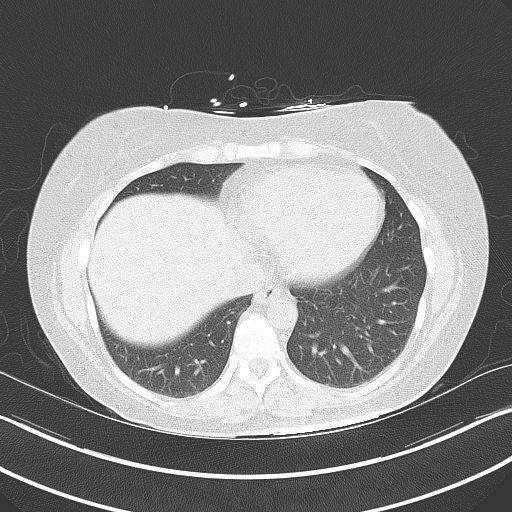
[im 18/27  soft-tissue]
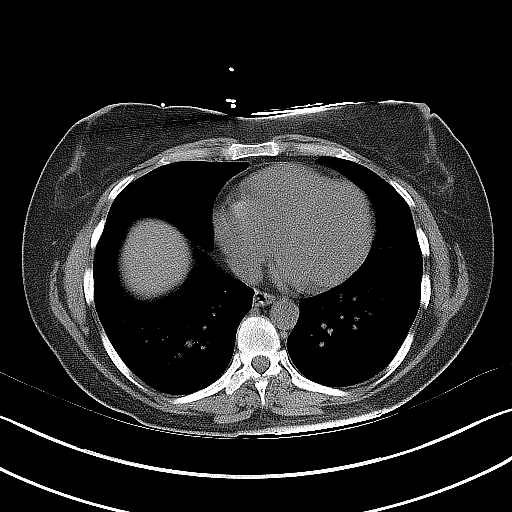
[im 18/27  lung]
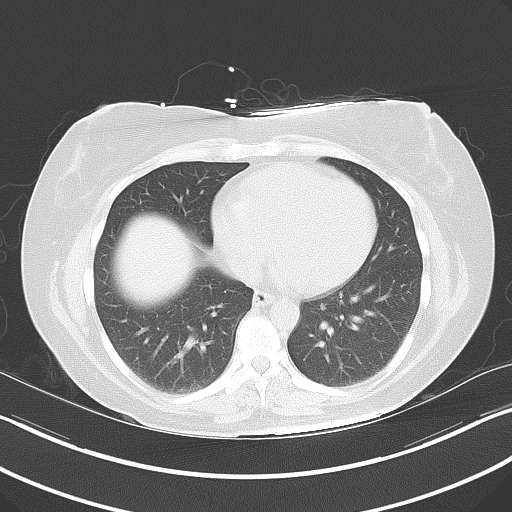
[im 22/27  soft-tissue]
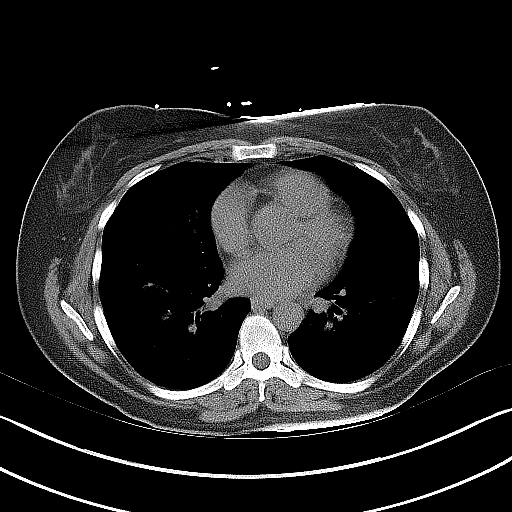
[im 22/27  lung]
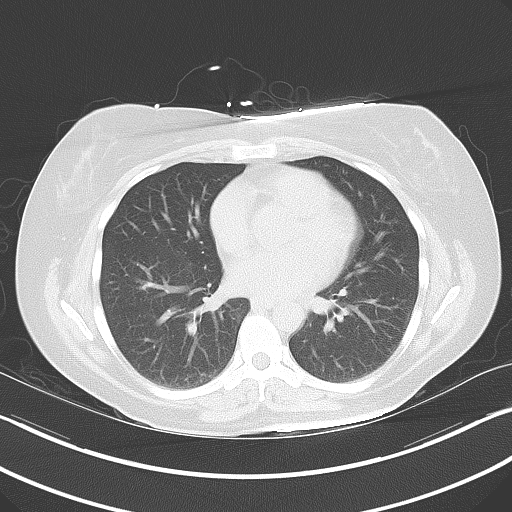

[Series 5: coronal · coronal · 0.67mm/px · 3 of 124 slices shown]
[im 42/124  soft-tissue]
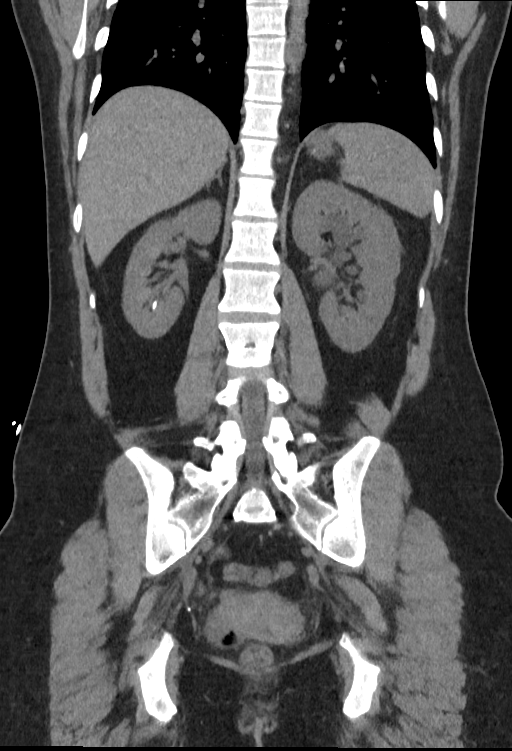
[im 55/124  soft-tissue]
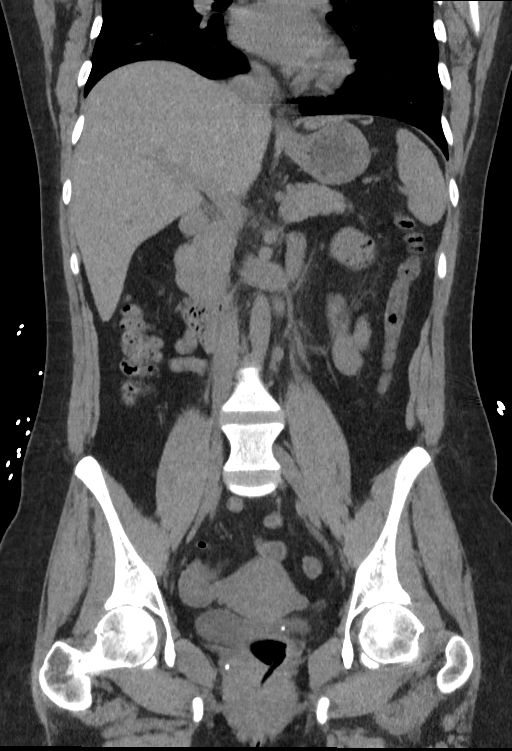
[im 69/124  soft-tissue]
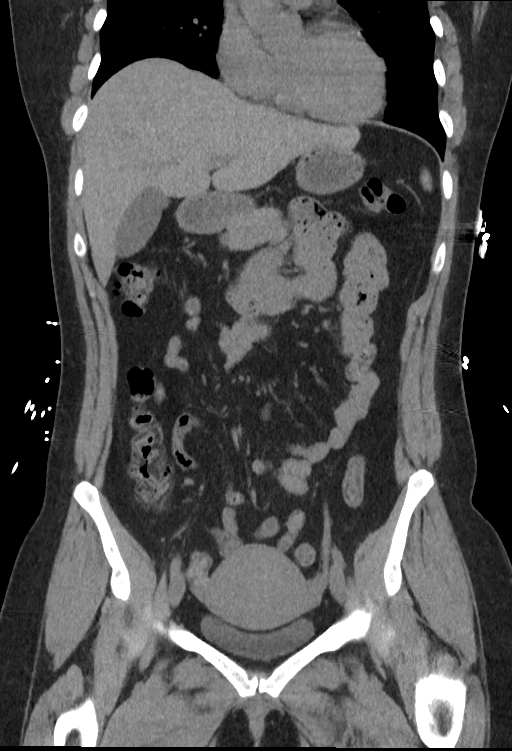

[Series 6: sagittal · sagittal · 0.49mm/px · 1 of 170 slices shown]
[im 57/170  soft-tissue]
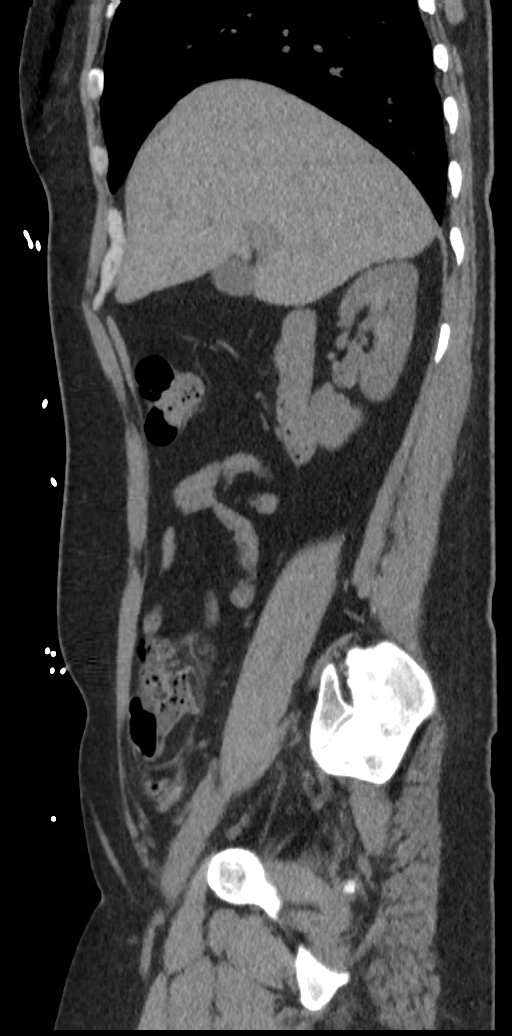

[9 of 46 positions shown; findings below may reference images not displayed]

FINDINGS: Lower chest: No acute abnormality.

Hepatobiliary: No solid liver abnormality is seen. No gallstones,
gallbladder wall thickening, or biliary dilatation.

Pancreas: Unremarkable. No pancreatic ductal dilatation or
surrounding inflammatory changes.

Spleen: Normal in size without significant abnormality.

Adrenals/Urinary Tract: Adrenal glands are unremarkable. There is a
2 mm calculus at the left ureterovesicular junction with mild left
hydronephrosis and hydroureter. There are multiple additional
nonobstructive bilateral renal calculi. Bladder is unremarkable.

Stomach/Bowel: Stomach is within normal limits. Appendix appears
normal. No evidence of bowel wall thickening, distention, or
inflammatory changes.

Vascular/Lymphatic: No significant vascular findings are present. No
enlarged abdominal or pelvic lymph nodes.

Reproductive: No mass or other significant abnormality.

Other: No abdominal wall hernia or abnormality. No abdominopelvic
ascites.

Musculoskeletal: No acute or significant osseous findings.
IMPRESSION: There is a 2 mm calculus at the left ureterovesicular junction with
mild left hydronephrosis and hydroureter. There are multiple
additional nonobstructive bilateral renal calculi.

## 2021-06-02 ENCOUNTER — Encounter: Payer: Self-pay | Admitting: Cardiology

## 2021-06-02 ENCOUNTER — Ambulatory Visit (INDEPENDENT_AMBULATORY_CARE_PROVIDER_SITE_OTHER): Payer: 59

## 2021-06-02 ENCOUNTER — Ambulatory Visit (INDEPENDENT_AMBULATORY_CARE_PROVIDER_SITE_OTHER): Payer: 59 | Admitting: Cardiology

## 2021-06-02 ENCOUNTER — Other Ambulatory Visit: Payer: Self-pay

## 2021-06-02 VITALS — BP 130/88 | HR 101 | Ht 62.0 in | Wt 161.4 lb

## 2021-06-02 DIAGNOSIS — R0602 Shortness of breath: Secondary | ICD-10-CM

## 2021-06-02 DIAGNOSIS — I471 Supraventricular tachycardia, unspecified: Secondary | ICD-10-CM | POA: Insufficient documentation

## 2021-06-02 MED ORDER — METOPROLOL SUCCINATE ER 50 MG PO TB24: 50.0000 mg | ORAL_TABLET | Freq: Every day | ORAL | 3 refills | Status: DC

## 2021-06-02 MED ORDER — FLECAINIDE ACETATE 50 MG PO TABS: 50.0000 mg | ORAL_TABLET | Freq: Two times a day (BID) | ORAL | 3 refills | Status: DC

## 2021-06-02 NOTE — Patient Instructions (Signed)
Medication Instructions:  Your physician has recommended you make the following change in your medication:   START taking metoprolol succinate (Toprol-XL) 50 mg daily   START taking flecainide (Tambocor) 50 mg daily. Start 1 week after starting Toprol-XL  *If you need a refill on your cardiac medications before your next appointment, please call your pharmacy*   Lab Work: None ordered  If you have labs (blood work) drawn today and your tests are completely normal, you will receive your results only by: MyChart Message (if you have MyChart) OR A paper copy in the mail If you have any lab test that is abnormal or we need to change your treatment, we will call you to review the results.   Testing/Procedures:  Echocardiogram - Your physician has requested that you have an echocardiogram in 2 weeks. Echocardiography is a painless test that uses sound waves to create images of your heart. It provides your doctor with information about the size and shape of your heart and how well your hearts chambers and valves are working. This procedure takes approximately one hour. There are no restrictions for this procedure.   2. Your provider has ordered a heart monitor to wear for 3 days. This will be mailed to your home with instructions on placement. Once you have finished the time frame requested, you will return monitor in box provided.     Follow-Up: At [facility], you and your health needs are our priority.  As part of our continuing mission to provide you with exceptional heart care, we have created designated Provider Care Teams.  These Care Teams include your primary Cardiologist (physician) and Advanced Practice Providers (APPs -  Physician Assistants and Nurse Practitioners) who all work together to provide you with the care you need, when you need it.  We recommend signing up for the patient portal called "MyChart".  Sign up information is provided on this After Visit Summary.  MyChart  is used to connect with patients for Virtual Visits (Telemedicine).  Patients are able to view lab/test results, encounter notes, upcoming appointments, etc.  Non-urgent messages can be sent to your provider as well.   To learn more about what you can do with MyChart, go to [email or web address].    Your next appointment:   4 week(s)  The format for your next appointment:   In Person  Provider:   Steffanie Dunn, MD{   Other Instructions N/A

## 2021-06-02 NOTE — Progress Notes (Signed)
Primary Care Provider: Patient, No Pcp Per (Inactive) [facility] Cardiologist: None Electrophysiologist: None  Clinic Note: Chief Complaint  Patient presents with   New Patient (Initial Visit)    Self Referral to establish care for SVT; patient saw Dr. Juliann Pares in 2016. Patient c/o SVT with a heart rate running in the 200's more frequently lately, has shortness of breath and pounding in chest. Medications reviewed by the patient verbally.    ===================================  ASSESSMENT/PLAN   Problem List Items Addressed This Visit       Cardiology Problems   SVT (supraventricular tachycardia) (HCC) - Primary (Chronic)    Frequent episodes of what sounds like SVT based on her history.  Previously documented SVT back in 2016 as well as 2018 and 2019. She did not seek medical assistance because of no insurance coverage.  At this point she seems to be very debilitated by these episodes.  She does have resting tachycardia.  Plan: With the amount of SVT she is having, I think she probably would be a candidate for ablation.  I will refer to EP. 3-day Zio patch monitor-this should be sufficient based on amount of symptoms she is noted. Start Toprol 50 mg daily.  After 1 week on Toprol, start flecainide 50 mg twice daily while awaiting EP evaluation. With the extent of SVT associated with dyspnea-we will check 2D echo. Not initially ordered, but will contact her to have CMP, CBC and TSH checked for baseline assessment.      Relevant Medications   atenolol (TENORMIN) 25 MG tablet   metoprolol succinate (TOPROL-XL) 50 MG 24 hr tablet   flecainide (TAMBOCOR) 50 MG tablet   Other Relevant Orders   EKG 12-Lead   LONG TERM MONITOR (3-14 DAYS)   Ambulatory referral to Cardiac Electrophysiology     Other   SOB (shortness of breath)    Dyspnea both with rest or exertion.  Plan: Check 2D echo      Relevant Orders   ECHOCARDIOGRAM COMPLETE    ===================================  HPI:    Jeanne David is a 41 y.o. female with prior history of SVT (noted in 2016) who is being seen today for the evaluation of SVT at the request of No ref. provider found -> self-referral.  Evette Doffing was last seen by Dr. Juliann Pares at [facility] 2016 for history of SVT and a murmur. ->  Referred to EP and started on beta-blocker.  Echo ordered, but not followed through .  Did not follow-up with EP. She subsequently had ER visits for SVT in April 2018, and then in December 2019 SVT in the setting of influenza infection.  -> She states that she never did follow-up because she did not have insurance at that time.  Unfortunately over the last several years, she has not had adequate insurance coverage and therefore has "been suffering "despite having significant symptoms.  She now does have insurance coverage and is seeking assistance.  Recent Hospitalizations: None recently.  Reviewed  CV studies:    The following studies were reviewed today: (if available, images/films reviewed: From Epic Chart or Care Everywhere) N/a   Interval History:   Jeanne David presents here today for very frequent episodes of fast heart rates.  Her resting heart rate is fast, and she says that she has multiple episodes of tachycardia to the point where she is essentially debilitated.  She says that she is worn out because of having frequent episodes that will wake her  up from sleep, happen during the day without warning.  She feels tired after each episode.  Sometimes it can last a few minutes that she is able to break them with vagal maneuvers, but otherwise they can last hours.  She notes having on average 2-3 episodes daily. Can last anywhere from 2 min to hours. Tries Vagal Maneuvers & can break them sometimes, but not always.  Takes additional Atenolol & no help.  3 x in last few years to ER -- usually does not break with Adenosine x 1 --  usually needs 2nd dose + IV rate control.   Can wake her up from sleep.  -- notes pain / fluttering in neck, SOB & chest tightness along with dizziness.   For years, did not have insurance & tried to avoid going to OR.  Would take extra doses of Atenolol (that she got from her friend members)-- usually no help.  Has not really been on standing dose of any medication.  No syncope.  + CP, neck pain & SOB with episodes.  Usually HR is fast all day.  Frequently has tiredness & chest tightness with minimal exertion.  Job not all that physical - but gets tired.  Feels as though she sleeps enough.   Very debilitating -- unable to do anything.   Can get SOB @ rest & with exertion. Off & on Ankle swelling.    Smokes - ~3/4  PPD. I cup of coffee, ~ 1/2 can soda.    CV Review of Symptoms (Summary) Cardiovascular ROS: positive for - chest pain, dyspnea on exertion, edema, orthopnea, palpitations, rapid heart rate, and shortness of breath negative for - loss of consciousness, paroxysmal nocturnal dyspnea, or TIA/amaurosis fugax.  REVIEWED OF SYSTEMS   Review of Systems  Constitutional:  Positive for malaise/fatigue.  HENT:  Negative for congestion and nosebleeds.   Respiratory:  Positive for shortness of breath. Negative for cough.   Cardiovascular:  Positive for leg swelling.       Per HPI  Gastrointestinal:  Negative for blood in stool and melena.  Genitourinary:  Negative for hematuria.  Musculoskeletal:  Negative for joint pain.  Neurological:  Positive for dizziness. Negative for loss of consciousness.  Psychiatric/Behavioral:  Positive for depression (seems down -- exasperated; gets tearful). Negative for memory loss. The patient is nervous/anxious and has insomnia.    I have reviewed and (if needed) personally updated the patient's problem list, medications, allergies, past medical and surgical history, social and family history.   PAST MEDICAL HISTORY   Past Medical History:   Diagnosis Date   Paroxysmal SVT (supraventricular tachycardia) ([place]) 2016   Not currently on any medications.  Takes atenolol prescribed 2 of a family member   Renal disorder    SVT (supraventricular tachycardia) ([place])     PAST SURGICAL HISTORY   Past Surgical History:  Procedure Laterality Date   None listed       There is no immunization history on file for this patient.  MEDICATIONS/ALLERGIES   Current Meds  Medication Sig   atenolol (TENORMIN) 25 MG tablet Take by mouth daily. ->  Not actually taking.    No Known Allergies  SOCIAL HISTORY/FAMILY HISTORY   Reviewed in Epic:   Social History   Tobacco Use   Smoking status: Every Day    Packs/day: 1.00    Years: 12.00    Pack years: 12.00    Types: Cigarettes   Smokeless tobacco: Never  Vaping Use   Vaping  Use: Every day  Substance Use Topics   Alcohol use: No   Drug use: No   Social History   Social History Narrative   Drinks maybe 1 cup of coffee a day and maybe a half a can of soda.      Does not routinely exercise, but does housework.   Family History  Problem Relation Age of Onset   COPD Mother    Hypertension Father     OBJCTIVE -PE, EKG, labs   Wt Readings from Last 3 Encounters:  06/02/21 161 lb 6 oz (73.2 kg)  11/14/18 143 lb (64.9 kg)  04/22/18 145 lb (65.8 kg)    Physical Exam: BP 130/88 (BP Location: Left Arm, Patient Position: Sitting, Cuff Size: Normal)    Pulse (!) 101    Ht 5\' 2"  (1.575 m)    Wt 161 lb 6 oz (73.2 kg)    SpO2 99%    BMI 29.52 kg/m  Physical Exam   Adult ECG Report  Rate: 101 ;  Rhythm: sinus tachycardia and nonspecific ST and T wave changes. ; Normal axis, intervals durations.  Narrative Interpretation: Stable/normal  SVT on EKG noted 08/19/2016  Recent Labs: Reviewed.  No recent labs. No results found for: CHOL, HDL, LDLCALC, LDLDIRECT, TRIG, CHOLHDL Lab Results  Component Value Date   CREATININE 0.77 11/14/2018   BUN 10 11/14/2018   NA 141  11/14/2018   K 3.9 11/14/2018   CL 108 11/14/2018   CO2 23 11/14/2018   CBC Latest Ref Rng & Units 11/14/2018 08/19/2016 01/02/2010  WBC 4.0 - 10.5 K/uL 9.2 10.7 8.9  Hemoglobin 12.0 - 15.0 g/dL 13.1 15.5 14.0  Hematocrit 36.0 - 46.0 % 40.1 45.3 40.5  Platelets 150 - 400 K/uL 336 341 161    No results found for: HGBA1C No results found for: TSH  ==================================================  COVID-19 Education: The signs and symptoms of COVID-19 were discussed with the patient and how to seek care for testing (follow up with PCP or arrange E-visit).    I spent a total of 28 minutes with the patient spent in direct patient consultation.  Additional time spent with chart review  / charting (studies, outside notes, etc): 17 min Total Time: 45 min  Current medicines are reviewed at length with the patient today.  (+/- concerns) none  This visit occurred during the SARS-CoV-2 public health emergency.  Safety protocols were in place, including screening questions prior to the visit, additional usage of staff PPE, and extensive cleaning of exam room while observing appropriate contact time as indicated for disinfecting solutions.  Notice: This dictation was prepared with Dragon dictation along with smart phrase technology. Any transcriptional errors that result from this process are unintentional and may not be corrected upon review.   Studies Ordered:  Orders Placed This Encounter  Procedures   Ambulatory referral to Cardiac Electrophysiology   LONG TERM MONITOR (3-14 DAYS)   EKG 12-Lead   ECHOCARDIOGRAM COMPLETE    Patient Instructions / Medication Changes & Studies & Tests Ordered   Patient Instructions  Medication Instructions:  Your physician has recommended you make the following change in your medication:   START taking metoprolol succinate (Toprol-XL) 50 mg daily   START taking flecainide (Tambocor) 50 mg daily. Start 1 week after starting Toprol-XL  *If you need a  refill on your cardiac medications before your next appointment, please call your pharmacy*   Lab Work: None ordered  If you have labs (blood work) drawn today and  your tests are completely normal, you will receive your results only by: MyChart Message (if you have MyChart) OR A paper copy in the mail If you have any lab test that is abnormal or we need to change your treatment, we will call you to review the results.   Testing/Procedures:  Echocardiogram - Your physician has requested that you have an echocardiogram in 2 weeks. Echocardiography is a painless test that uses sound waves to create images of your heart. It provides your doctor with information about the size and shape of your heart and how well your hearts chambers and valves are working. This procedure takes approximately one hour. There are no restrictions for this procedure.   2. Your provider has ordered a heart monitor to wear for 3 days. This will be mailed to your home with instructions on placement. Once you have finished the time frame requested, you will return monitor in box provided.  Follow-Up: At [facility], you and your health needs are our priority.  As part of our continuing mission to provide you with exceptional heart care, we have created designated Provider Care Teams.  These Care Teams include your primary Cardiologist (physician) and Advanced Practice Providers (APPs -  Physician Assistants and Nurse Practitioners) who all work together to provide you with the care you need, when you need it.  We recommend signing up for the patient portal called "MyChart".  Sign up information is provided on this After Visit Summary.  MyChart is used to connect with patients for Virtual Visits (Telemedicine).  Patients are able to view lab/test results, encounter notes, upcoming appointments, etc.  Non-urgent messages can be sent to your provider as well.   To learn more about what you can do with MyChart, go to  [email or web address].    Your next appointment:   4 week(s)  The format for your next appointment:   In Person  Provider:   Lars Mage, MD{   Other Instructions N/A     Glenetta Hew, M.D., M.S. Interventional Cardiologist   Pager # [phone] Phone # [phone] [address] [place], [place] [address]   Thank you for choosing [facility] [place]!!

## 2021-06-03 ENCOUNTER — Telehealth: Payer: Self-pay | Admitting: Emergency Medicine

## 2021-06-03 NOTE — Telephone Encounter (Signed)
Called patient to provide clarification on when she should wear her monitor after speaking with Dr. Herbie Baltimore yesterday evening. No answer, lmtcb.

## 2021-06-05 ENCOUNTER — Encounter: Payer: Self-pay | Admitting: Cardiology

## 2021-06-05 DIAGNOSIS — R0602 Shortness of breath: Secondary | ICD-10-CM | POA: Insufficient documentation

## 2021-06-05 NOTE — Assessment & Plan Note (Addendum)
Frequent episodes of what sounds like SVT based on her history.  Previously documented SVT back in 2016 as well as 2018 and 2019. She did not seek medical assistance because of no insurance coverage.  At this point she seems to be very debilitated by these episodes.  She does have resting tachycardia.  Plan: With the amount of SVT she is having, I think she probably would be a candidate for ablation.  I will refer to EP.  3-day Zio patch monitor-this should be sufficient based on amount of symptoms she is noted.  Start Toprol 50 mg daily.  After 1 week on Toprol, start flecainide 50 mg twice daily while awaiting EP evaluation.  With the extent of SVT associated with dyspnea-we will check 2D echo.  Not initially ordered, but will contact her to have CMP, CBC and TSH checked for baseline assessment.

## 2021-06-05 NOTE — Assessment & Plan Note (Signed)
Dyspnea both with rest or exertion.  Plan: Check 2D echo

## 2021-06-06 ENCOUNTER — Telehealth: Payer: Self-pay | Admitting: *Deleted

## 2021-06-06 DIAGNOSIS — R0602 Shortness of breath: Secondary | ICD-10-CM

## 2021-06-06 DIAGNOSIS — I471 Supraventricular tachycardia: Secondary | ICD-10-CM

## 2021-06-06 NOTE — Telephone Encounter (Signed)
-----   Message from Marykay Lex, MD sent at 06/05/2021 10:07 PM EST ----- I saw this patient on the second.  Meant to order some labs that she can get when she comes in for echo.  I forgot to tell Maralyn Sago.  Can we order them CMP, CBC, TSH.  Bryan Lemma, MD

## 2021-06-06 NOTE — Telephone Encounter (Signed)
Attempted to call pt. No answer. Lmtcb.   Pt's echo is scheduled 06/20/21.   Lab orders placed.

## 2021-06-09 NOTE — Telephone Encounter (Signed)
Scheduled labs same day as echo 2/20.    patient aware and has not further questions.

## 2021-06-15 ENCOUNTER — Telehealth: Payer: Self-pay | Admitting: Cardiology

## 2021-06-15 NOTE — Telephone Encounter (Signed)
Received a call from Joselyn at [facility]. She informed me that the patient had 28 episodes of SVT, one episode was sustained for 1 minute with average HR of 192 bpm. Longest episode lasted  2.31 with an average HR of 149 bpm.  Date of alert:  06/15/2021   [facility] Cardiologist: Dr, Wayland [facility] EP:  Dr. Lalla Brothers as of 06/29/21 Monitor Information:  Long Term Monitor [ZioXT]  Reason:  SVT Ordering provider:  Dr. Herbie Baltimore   Alert  SVT This is the 1st alert for this rhythm.   Next Cardiology Appointment   Date:  06/29/21  Provider:  Dr. Rosamaria Lints, RN  06/15/2021 10:45 AM

## 2021-06-15 NOTE — Telephone Encounter (Signed)
ZIO calling with abnormal results  Transferred to Jeanne David 

## 2021-06-15 NOTE — Telephone Encounter (Signed)
Somewhat expected.    Monitor is to document PSVT burden.  -> once monitor done- she is to start Flecainide.  Bryan Lemma

## 2021-06-20 ENCOUNTER — Other Ambulatory Visit: Payer: Self-pay

## 2021-06-20 ENCOUNTER — Other Ambulatory Visit (INDEPENDENT_AMBULATORY_CARE_PROVIDER_SITE_OTHER): Payer: 59

## 2021-06-20 ENCOUNTER — Ambulatory Visit (INDEPENDENT_AMBULATORY_CARE_PROVIDER_SITE_OTHER): Payer: 59

## 2021-06-20 DIAGNOSIS — R0602 Shortness of breath: Secondary | ICD-10-CM

## 2021-06-20 DIAGNOSIS — I471 Supraventricular tachycardia: Secondary | ICD-10-CM | POA: Diagnosis not present

## 2021-06-20 LAB — ECHOCARDIOGRAM COMPLETE
AR max vel: 2.6 cm2
AV Area VTI: 2.2 cm2
AV Area mean vel: 2.33 cm2
AV Mean grad: 4 mmHg
AV Peak grad: 5.8 mmHg
Ao pk vel: 1.2 m/s
Area-P 1/2: 4.36 cm2
Calc EF: 58.2 %
MV M vel: 5.02 m/s
MV Peak grad: 100.8 mmHg
S' Lateral: 3.4 cm
Single Plane A2C EF: 61.8 %
Single Plane A4C EF: 56.8 %

## 2021-06-21 LAB — COMPREHENSIVE METABOLIC PANEL
ALT: 17 IU/L (ref 0–32)
AST: 16 IU/L (ref 0–40)
Albumin/Globulin Ratio: 1.5 (ref 1.2–2.2)
Albumin: 4.6 g/dL (ref 3.8–4.8)
Alkaline Phosphatase: 95 IU/L (ref 44–121)
BUN/Creatinine Ratio: 18 (ref 9–23)
BUN: 11 mg/dL (ref 6–20)
Bilirubin Total: 0.3 mg/dL (ref 0.0–1.2)
CO2: 23 mmol/L (ref 20–29)
Calcium: 9.4 mg/dL (ref 8.7–10.2)
Chloride: 102 mmol/L (ref 96–106)
Creatinine, Ser: 0.62 mg/dL (ref 0.57–1.00)
Globulin, Total: 3 g/dL (ref 1.5–4.5)
Glucose: 90 mg/dL (ref 70–99)
Potassium: 4.2 mmol/L (ref 3.5–5.2)
Sodium: 139 mmol/L (ref 134–144)
Total Protein: 7.6 g/dL (ref 6.0–8.5)
eGFR: 117 mL/min/{1.73_m2} (ref >59)

## 2021-06-21 LAB — CBC
Hematocrit: 40.6 % (ref 34.0–46.6)
Hemoglobin: 13 g/dL (ref 11.1–15.9)
MCH: 25.7 pg — ABNORMAL LOW (ref 26.6–33.0)
MCHC: 32 g/dL (ref 31.5–35.7)
MCV: 80 fL (ref 79–97)
Platelets: 415 10*3/uL (ref 150–450)
RBC: 5.06 x10E6/uL (ref 3.77–5.28)
RDW: 13.2 % (ref 11.7–15.4)
WBC: 8.4 10*3/uL (ref 3.4–10.8)

## 2021-06-21 LAB — TSH: TSH: 0.947 u[IU]/mL (ref 0.450–4.500)

## 2021-06-29 ENCOUNTER — Encounter: Payer: Self-pay | Admitting: *Deleted

## 2021-06-29 ENCOUNTER — Encounter: Payer: Self-pay | Admitting: Cardiology

## 2021-06-29 ENCOUNTER — Ambulatory Visit (INDEPENDENT_AMBULATORY_CARE_PROVIDER_SITE_OTHER): Payer: 59 | Admitting: Cardiology

## 2021-06-29 ENCOUNTER — Telehealth: Payer: Self-pay | Admitting: Cardiology

## 2021-06-29 ENCOUNTER — Other Ambulatory Visit: Payer: Self-pay

## 2021-06-29 VITALS — BP 114/90 | HR 66 | Ht 62.0 in | Wt 157.0 lb

## 2021-06-29 DIAGNOSIS — I471 Supraventricular tachycardia: Secondary | ICD-10-CM

## 2021-06-29 DIAGNOSIS — Z01818 Encounter for other preprocedural examination: Secondary | ICD-10-CM

## 2021-06-29 NOTE — Telephone Encounter (Signed)
CALLED PATIENT TO SCHEDULE 4 WEEK FOLLOW UP APPT FROM 4/20

## 2021-06-29 NOTE — Patient Instructions (Addendum)
Medication Instructions:  ?- Your physician recommends that you continue on your current medications as directed. Please refer to the Current Medication list given to you today. ? ?*If you need a refill on your cardiac medications before your next appointment, please call your pharmacy* ? ? ?Lab Work: ?- Your physician recommends that you return for lab work:  July 28, 2021 @ 9:15 am- BMP/ CBC ? ?If you have labs (blood work) drawn today and your tests are completely normal, you will receive your results only by: ?MyChart Message (if you have MyChart) OR ?A paper copy in the mail ?If you have any lab test that is abnormal or we need to change your treatment, we will call you to review the results. ? ? ?Testing/Procedures: ?- Your physician has recommended that you have a SVT ablation. Catheter ablation is a medical procedure used to treat some cardiac arrhythmias (irregular heartbeats). During catheter ablation, a long, thin, flexible tube is put into a blood vessel in your groin (upper thigh), or neck. This tube is called an ablation catheter. It is then guided to your heart through the blood vessel. Radio frequency waves destroy small areas of heart tissue where abnormal heartbeats may cause an arrhythmia to start. Please see the instruction sheet given to you today. ? ? ? ?Follow-Up: ?At [facility], you and your health needs are our priority.  As part of our continuing mission to provide you with exceptional heart care, we have created designated Provider Care Teams.  These Care Teams include your primary Cardiologist (physician) and Advanced Practice Providers (APPs -  Physician Assistants and Nurse Practitioners) who all work together to provide you with the care you need, when you need it. ? ?We recommend signing up for the patient portal called "MyChart".  Sign up information is provided on this After Visit Summary.  MyChart is used to connect with patients for Virtual Visits (Telemedicine).  Patients  are able to view lab/test results, encounter notes, upcoming appointments, etc.  Non-urgent messages can be sent to your provider as well.   ?To learn more about what you can do with MyChart, go to [email or web address].   ? ?Your next appointment:   ?4 week(s) (from 08/18/21)  ? ?The format for your next appointment:   ?In Person ? ?Provider:   ?Steffanie Dunn, MD  ? ? ?Other Instructions ? ?Cardiac Ablation ?Cardiac ablation is a procedure to destroy (ablate) some heart tissue that is sending bad signals. These bad signals cause problems in heart rhythm. ?The heart has many areas that make these signals. If there are problems in these areas, they can make the heart beat in a way that is not normal. Destroying some tissues can help make the heart rhythm normal. ?Tell your doctor about: ?Any allergies you have. ?All medicines you are taking. These include vitamins, herbs, eye drops, creams, and over-the-counter medicines. ?Any problems you or family members have had with medicines that make you fall asleep (anesthetics). ?Any blood disorders you have. ?Any surgeries you have had. ?Any medical conditions you have, such as kidney failure. ?Whether you are pregnant or may be pregnant. ?What are the risks? ?This is a safe procedure. But problems may occur, including: ?Infection. ?Bruising and bleeding. ?Bleeding into the chest. ?Stroke or blood clots. ?Damage to nearby areas of your body. ?Allergies to medicines or dyes. ?The need for a pacemaker if the normal system is damaged. ?Failure of the procedure to treat the problem. ?What happens before the procedure? ?Medicines ?Ask  your doctor about: ?Changing or stopping your normal medicines. This is important. ?Taking aspirin and ibuprofen. Do not take these medicines unless your doctor tells you to take them. ?Taking other medicines, vitamins, herbs, and supplements. ?General instructions ?Follow instructions from your doctor about what you cannot eat or drink. ?Plan  to have someone take you home from the hospital or clinic. ?If you will be going home right after the procedure, plan to have someone with you for 24 hours. ?Ask your doctor what steps will be taken to prevent infection. ?What happens during the procedure? ? ?An IV tube will be put into one of your veins. ?You will be given a medicine to help you relax. ?The skin on your neck or groin will be numbed. ?A cut (incision) will be made in your neck or groin. A needle will be put through your cut and into a large vein. ?A tube (catheter) will be put into the needle. The tube will be moved to your heart. ?Dye may be put through the tube. This helps your doctor see your heart. ?Small devices (electrodes) on the tube will send out signals. ?A type of energy will be used to destroy some heart tissue. ?The tube will be taken out. ?Pressure will be held on your cut. This helps stop bleeding. ?A bandage will be put over your cut. ?The exact procedure may vary among doctors and hospitals. ?What happens after the procedure? ?You will be watched until you leave the hospital or clinic. This includes checking your heart rate, breathing rate, oxygen, and blood pressure. ?Your cut will be watched for bleeding. You will need to lie still for a few hours. ?Do not drive for 24 hours or as long as your doctor tells you. ?Summary ?Cardiac ablation is a procedure to destroy some heart tissue. This is done to treat heart rhythm problems. ?Tell your doctor about any medical conditions you may have. Tell him or her about all medicines you are taking to treat them. ?This is a safe procedure. But problems may occur. These include infection, bruising, bleeding, and damage to nearby areas of your body. ?Follow what your doctor tells you about food and drink. You may also be told to change or stop some of your medicines. ?After the procedure, do not drive for 24 hours or as long as your doctor tells you. ?This information is not intended to replace  advice given to you by your health care provider. Make sure you discuss any questions you have with your health care provider. ?Document Revised: 03/20/2019 Document Reviewed: 03/20/2019 ?Elsevier Patient Education ? 2022 Elsevier Inc. ? ? ?

## 2021-06-29 NOTE — Progress Notes (Signed)
?Electrophysiology Office Note:   ? ?Date:  06/29/2021  ? ?ID:  Jeanne David, DOB 1981-03-30, MRN [id number] ? ?PCP:  Patient, No Pcp Per (Inactive)  ?[facility] Cardiologist:  None  ?[facility] Electrophysiologist:  Lanier Prude, MD  ? ?Referring MD: Marykay Lex, MD  ? ?Chief Complaint: SVT ? ?History of Present Illness:   ? ?Jeanne David is a 41 y.o. female who presents for an evaluation of SVT at the request of Dr. Herbie Baltimore. Their medical history includes SVT.  The patient was seen by Dr. Herbie Baltimore on June 02, 2021.  The episodes occur during the day and night.  She is able to break the episodes with vagal maneuvers but otherwise they can last hours at a time.  They are debilitating.  She uses atenolol.  She is gone multiple times emergency department. ?She is with her daughter today in clinic. ?She tells me the episodes have become much more frequent recently.  Her first episode when she was apparently 41 years old.  They can last minutes to hours at a time.  Are highly symptomatic.  She feels fatigued during the episode and afterwards.  She calls the episodes "debilitating". ? ?  ?Past Medical History:  ?Diagnosis Date  ? Paroxysmal SVT (supraventricular tachycardia) (HCC) 2016  ? Not currently on any medications.  Takes atenolol prescribed 2 of a family member  ? Renal disorder   ? SVT (supraventricular tachycardia) (HCC)   ? ? ?Past Surgical History:  ?Procedure Laterality Date  ? None listed    ? ? ?Current Medications: ?Current Meds  ?Medication Sig  ? flecainide (TAMBOCOR) 50 MG tablet Take 1 tablet (50 mg total) by mouth 2 (two) times daily. Start taking after you have been taking Toprol for 1 week.  ? metoprolol succinate (TOPROL-XL) 50 MG 24 hr tablet Take 1 tablet (50 mg total) by mouth daily. Take with or immediately following a meal.  ?  ? ?Allergies:   Patient has no known allergies.  ? ?Social History  ? ?Socioeconomic History  ? Marital status: Married  ?  Spouse name: Not  on file  ? Number of children: Not on file  ? Years of education: Not on file  ? Highest education level: Not on file  ?Occupational History  ? Not on file  ?Tobacco Use  ? Smoking status: Every Day  ?  Packs/day: 1.00  ?  Years: 12.00  ?  Pack years: 12.00  ?  Types: Cigarettes  ? Smokeless tobacco: Never  ?Vaping Use  ? Vaping Use: Every day  ?Substance and Sexual Activity  ? Alcohol use: No  ? Drug use: No  ? Sexual activity: Not on file  ?Other Topics Concern  ? Not on file  ?Social History Narrative  ? Drinks maybe 1 cup of coffee a day and maybe a half a can of soda.  ?   ? Does not routinely exercise, but does housework.  ? ?Social Determinants of Health  ? ?Financial Resource Strain: Not on file  ?Food Insecurity: Not on file  ?Transportation Needs: Not on file  ?Physical Activity: Not on file  ?Stress: Not on file  ?Social Connections: Not on file  ?  ? ?Family History: ?The patient's family history includes COPD in her mother; Hypertension in her father. ? ?ROS:   ?Please see the history of present illness.    ?All other systems reviewed and are negative. ? ?EKGs/Labs/Other Studies Reviewed:   ? ?The following  studies were reviewed today: ? ?June 20, 2021 echo ?Left ventricular function normal, 55% ?Right ventricular function normal ?Mild MR ? ? ?June 15, 2021 ZIO monitor ?28 SVTs, longest lasting 2 minutes and 31 seconds with average rate of 149 bpm.  Symptom triggered events correspond to SVT.  Rare ventricular ectopy. ? ? ? ?EKG:  The ekg ordered today demonstrates sinus rhythm.  No preexcitation. ? ? ?Recent Labs: ?06/20/2021: ALT 17; BUN 11; Creatinine, Ser 0.62; Hemoglobin 13.0; Platelets 415; Potassium 4.2; Sodium 139; TSH 0.947  ?Recent Lipid Panel ?No results found for: CHOL, TRIG, HDL, CHOLHDL, VLDL, LDLCALC, LDLDIRECT ? ?Physical Exam:   ? ?VS:  BP 114/90 (BP Location: Left Arm, Patient Position: Sitting, Cuff Size: Normal)   Pulse 66   Ht 5\' 2"  (1.575 m)   Wt 157 lb (71.2 kg)   SpO2  99%   BMI 28.72 kg/m?    ? ?Wt Readings from Last 3 Encounters:  ?06/29/21 157 lb (71.2 kg)  ?06/02/21 161 lb 6 oz (73.2 kg)  ?11/14/18 143 lb (64.9 kg)  ?  ? ?GEN:  Well nourished, well developed in no acute distress ?HEENT: Normal ?NECK: No JVD; No carotid bruits ?LYMPHATICS: No lymphadenopathy ?CARDIAC: RRR, no murmurs, rubs, gallops ?RESPIRATORY:  Clear to auscultation without rales, wheezing or rhonchi  ?ABDOMEN: Soft, non-tender, non-distended ?MUSCULOSKELETAL:  No edema; No deformity  ?SKIN: Warm and dry ?NEUROLOGIC:  Alert and oriented x 3 ?PSYCHIATRIC:  Normal affect  ? ? ?  ? ?ASSESSMENT:   ? ?1. SVT (supraventricular tachycardia) (HCC)   ?2. Pre-op evaluation   ? ?PLAN:   ? ?In order of problems listed above: ? ?#SVT ?Highly symptomatic.  Regular, narrow complex tachycardia.  Recurrent despite treatment with flecainide and metoprolol.  I discussed treatment options including alternative antiarrhythmic drugs and catheter ablation.  I discussed the risks, recovery and likelihood of success with a EP study and ablation.  She would like to proceed with scheduling.  She will hold her flecainide and metoprolol for 5 days prior to the procedure to maximize the chances of tachycardia induction. ? ?Risk, benefits, and alternatives to EP study and radiofrequency ablation for SVT were also discussed in detail today. These risks include but are not limited to complete heart block, stroke, bleeding, vascular damage, tamponade, perforation, and death. The patient understands these risk and wishes to proceed.  We will therefore proceed with catheter ablation at the next available time.  Carto and ICE and anesthesia are requested for the procedure.   ? ? ? ? ? ? ?Total time spent with patient today 60 minutes. This includes reviewing records, evaluating the patient and coordinating care. ? ?Medication Adjustments/Labs and Tests Ordered: ?Current medicines are reviewed at length with the patient today.  Concerns  regarding medicines are outlined above.  ?Orders Placed This Encounter  ?Procedures  ? CBC w/Diff  ? Basic Metabolic Panel (BMET)  ? EKG 12-Lead  ? ?No orders of the defined types were placed in this encounter. ? ? ? ?Signed, ?11/16/18 T. Sheria Lang, MD, [facility], [facility] ?06/29/2021 1:54 PM    ?Electrophysiology ?[facility] ?

## 2021-07-13 ENCOUNTER — Encounter: Payer: Self-pay | Admitting: Cardiology

## 2021-07-27 ENCOUNTER — Other Ambulatory Visit: Payer: Self-pay

## 2021-07-27 ENCOUNTER — Other Ambulatory Visit (INDEPENDENT_AMBULATORY_CARE_PROVIDER_SITE_OTHER): Payer: 59

## 2021-07-27 DIAGNOSIS — Z01818 Encounter for other preprocedural examination: Secondary | ICD-10-CM

## 2021-07-27 DIAGNOSIS — I471 Supraventricular tachycardia: Secondary | ICD-10-CM

## 2021-07-28 ENCOUNTER — Other Ambulatory Visit: Payer: 59

## 2021-07-28 LAB — CBC WITH DIFFERENTIAL/PLATELET
Basophils Absolute: 0.1 10*3/uL (ref 0.0–0.2)
Basos: 1 %
EOS (ABSOLUTE): 0.1 10*3/uL (ref 0.0–0.4)
Eos: 1 %
Hematocrit: 36.9 % (ref 34.0–46.6)
Hemoglobin: 12.2 g/dL (ref 11.1–15.9)
Immature Grans (Abs): 0 10*3/uL (ref 0.0–0.1)
Immature Granulocytes: 0 %
Lymphocytes Absolute: 2.1 10*3/uL (ref 0.7–3.1)
Lymphs: 27 %
MCH: 25.8 pg — ABNORMAL LOW (ref 26.6–33.0)
MCHC: 33.1 g/dL (ref 31.5–35.7)
MCV: 78 fL — ABNORMAL LOW (ref 79–97)
Monocytes Absolute: 0.4 10*3/uL (ref 0.1–0.9)
Monocytes: 5 %
Neutrophils Absolute: 5 10*3/uL (ref 1.4–7.0)
Neutrophils: 66 %
Platelets: 376 10*3/uL (ref 150–450)
RBC: 4.72 x10E6/uL (ref 3.77–5.28)
RDW: 12.7 % (ref 11.7–15.4)
WBC: 7.7 10*3/uL (ref 3.4–10.8)

## 2021-07-28 LAB — BASIC METABOLIC PANEL
BUN/Creatinine Ratio: 13 (ref 9–23)
BUN: 8 mg/dL (ref 6–20)
CO2: 24 mmol/L (ref 20–29)
Calcium: 9.5 mg/dL (ref 8.7–10.2)
Chloride: 102 mmol/L (ref 96–106)
Creatinine, Ser: 0.63 mg/dL (ref 0.57–1.00)
Glucose: 89 mg/dL (ref 70–99)
Potassium: 4.4 mmol/L (ref 3.5–5.2)
Sodium: 139 mmol/L (ref 134–144)
eGFR: 116 mL/min/{1.73_m2} (ref >59)

## 2021-08-16 NOTE — Pre-Procedure Instructions (Signed)
Attempted to call patient regarding procedure.  Left voicemail on the following items: ?Arrival time 1130 ?Nothing to eat or drink after midnight ?No meds AM of procedure ?Responsible person to drive you home and stay with you for 24 hrs ? ? ?   ?

## 2021-08-18 ENCOUNTER — Encounter (HOSPITAL_COMMUNITY)
Admission: RE | Disposition: A | Discharge status: Home / Self Care | Payer: Self-pay | Source: Home / Self Care | Attending: Cardiology

## 2021-08-18 ENCOUNTER — Ambulatory Visit (HOSPITAL_COMMUNITY): Payer: 59 | Admitting: Certified Registered Nurse Anesthetist

## 2021-08-18 ENCOUNTER — Ambulatory Visit (HOSPITAL_BASED_OUTPATIENT_CLINIC_OR_DEPARTMENT_OTHER): Payer: 59 | Admitting: Certified Registered Nurse Anesthetist

## 2021-08-18 ENCOUNTER — Encounter (HOSPITAL_COMMUNITY): Payer: Self-pay | Admitting: Cardiology

## 2021-08-18 ENCOUNTER — Ambulatory Visit (HOSPITAL_COMMUNITY)
Admission: RE | Admit: 2021-08-18 | Discharge: 2021-08-18 | Disposition: A | Discharge status: Home / Self Care | Payer: 59 | Attending: Cardiology | Admitting: Cardiology

## 2021-08-18 ENCOUNTER — Other Ambulatory Visit: Payer: Self-pay

## 2021-08-18 DIAGNOSIS — Z8679 Personal history of other diseases of the circulatory system: Secondary | ICD-10-CM

## 2021-08-18 DIAGNOSIS — I471 Supraventricular tachycardia: Secondary | ICD-10-CM

## 2021-08-18 DIAGNOSIS — I4719 Other supraventricular tachycardia: Secondary | ICD-10-CM

## 2021-08-18 HISTORY — DX: Other supraventricular tachycardia: I47.19

## 2021-08-18 HISTORY — PX: SVT ABLATION: EP1225

## 2021-08-18 HISTORY — DX: Personal history of other diseases of the circulatory system: Z86.79

## 2021-08-18 LAB — PREGNANCY, URINE: Preg Test, Ur: NEGATIVE

## 2021-08-18 SURGERY — SVT ABLATION
Anesthesia: Monitor Anesthesia Care

## 2021-08-18 MED ORDER — SODIUM CHLORIDE 0.9% FLUSH: 3.0000 mL | INTRAVENOUS | Status: DC | PRN

## 2021-08-18 MED ORDER — PROPOFOL 10 MG/ML IV BOLUS
INTRAVENOUS | Status: DC | PRN
  Administered 2021-08-18: 30 mg via INTRAVENOUS
  Administered 2021-08-18 (×3): 20 mg via INTRAVENOUS

## 2021-08-18 MED ORDER — SODIUM CHLORIDE 0.9 % IV SOLN: INTRAVENOUS | Status: DC

## 2021-08-18 MED ORDER — ONDANSETRON HCL 4 MG/2ML IJ SOLN
4.0000 mg | Freq: Four times a day (QID) | INTRAMUSCULAR | Status: DC | PRN
  Administered 2021-08-18: 4 mg via INTRAVENOUS
  Filled 2021-08-18: qty 2

## 2021-08-18 MED ORDER — HEPARIN SODIUM (PORCINE) 1000 UNIT/ML IJ SOLN
INTRAMUSCULAR | Status: DC | PRN
  Administered 2021-08-18: 1000 [IU] via INTRAVENOUS

## 2021-08-18 MED ORDER — SODIUM CHLORIDE 0.9% FLUSH: 3.0000 mL | Freq: Two times a day (BID) | INTRAVENOUS | Status: DC

## 2021-08-18 MED ORDER — ISOPROTERENOL HCL 0.2 MG/ML IJ SOLN
INTRAMUSCULAR | Status: DC | PRN
  Administered 2021-08-18: 2 ug/min via INTRAVENOUS

## 2021-08-18 MED ORDER — ACETAMINOPHEN 325 MG PO TABS: 650.0000 mg | ORAL_TABLET | ORAL | Status: DC | PRN

## 2021-08-18 MED ORDER — ISOPROTERENOL HCL 0.2 MG/ML IJ SOLN
INTRAMUSCULAR | Status: AC
  Filled 2021-08-18: qty 5

## 2021-08-18 MED ORDER — PHENYLEPHRINE HCL-NACL 20-0.9 MG/250ML-% IV SOLN
INTRAVENOUS | Status: DC | PRN
  Administered 2021-08-18: 70 ug/min via INTRAVENOUS

## 2021-08-18 MED ORDER — BUPIVACAINE HCL (PF) 0.25 % IJ SOLN
INTRAMUSCULAR | Status: AC
  Filled 2021-08-18: qty 30

## 2021-08-18 MED ORDER — PHENYLEPHRINE HCL-NACL 20-0.9 MG/250ML-% IV SOLN
INTRAVENOUS | Status: AC
  Filled 2021-08-18: qty 250

## 2021-08-18 MED ORDER — FENTANYL CITRATE (PF) 100 MCG/2ML IJ SOLN
INTRAMUSCULAR | Status: DC | PRN
  Administered 2021-08-18: 50 ug via INTRAVENOUS
  Administered 2021-08-18: 12.5 ug via INTRAVENOUS
  Administered 2021-08-18: 50 ug via INTRAVENOUS
  Administered 2021-08-18: 25 ug via INTRAVENOUS
  Administered 2021-08-18: 50 ug via INTRAVENOUS
  Administered 2021-08-18: 12.5 ug via INTRAVENOUS

## 2021-08-18 MED ORDER — MIDAZOLAM HCL 5 MG/5ML IJ SOLN
INTRAMUSCULAR | Status: DC | PRN
  Administered 2021-08-18: 2 mg via INTRAVENOUS

## 2021-08-18 MED ORDER — PHENYLEPHRINE 80 MCG/ML (10ML) SYRINGE FOR IV PUSH (FOR BLOOD PRESSURE SUPPORT)
PREFILLED_SYRINGE | INTRAVENOUS | Status: AC
  Filled 2021-08-18: qty 20

## 2021-08-18 MED ORDER — HEPARIN SODIUM (PORCINE) 1000 UNIT/ML IJ SOLN
INTRAMUSCULAR | Status: AC
  Filled 2021-08-18: qty 10

## 2021-08-18 MED ORDER — BUPIVACAINE HCL (PF) 0.25 % IJ SOLN
INTRAMUSCULAR | Status: DC | PRN
Start: 2021-08-18 — End: 2021-08-18
  Administered 2021-08-18: 40 mL

## 2021-08-18 MED ORDER — LIDOCAINE 2% (20 MG/ML) 5 ML SYRINGE
INTRAMUSCULAR | Status: DC | PRN
  Administered 2021-08-18: 60 mg via INTRAVENOUS

## 2021-08-18 MED ORDER — HEPARIN (PORCINE) IN NACL 1000-0.9 UT/500ML-% IV SOLN
INTRAVENOUS | Status: AC
  Filled 2021-08-18: qty 1500

## 2021-08-18 MED ORDER — PHENYLEPHRINE 80 MCG/ML (10ML) SYRINGE FOR IV PUSH (FOR BLOOD PRESSURE SUPPORT)
PREFILLED_SYRINGE | INTRAVENOUS | Status: DC | PRN
  Administered 2021-08-18 (×4): 80 ug via INTRAVENOUS
  Administered 2021-08-18: 120 ug via INTRAVENOUS
  Administered 2021-08-18: 80 ug via INTRAVENOUS
  Administered 2021-08-18: 120 ug via INTRAVENOUS

## 2021-08-18 MED ORDER — HEPARIN (PORCINE) IN NACL 1000-0.9 UT/500ML-% IV SOLN
INTRAVENOUS | Status: DC | PRN
  Administered 2021-08-18 (×2): 500 mL

## 2021-08-18 MED ORDER — ONDANSETRON HCL 4 MG/2ML IJ SOLN
INTRAMUSCULAR | Status: DC | PRN
  Administered 2021-08-18: 4 mg via INTRAVENOUS

## 2021-08-18 MED ORDER — SODIUM CHLORIDE 0.9 % IV SOLN: 250.0000 mL | INTRAVENOUS | Status: DC | PRN

## 2021-08-18 MED ORDER — PROPOFOL 500 MG/50ML IV EMUL
INTRAVENOUS | Status: DC | PRN
Start: 2021-08-18 — End: 2021-08-18
  Administered 2021-08-18: 75 ug/kg/min via INTRAVENOUS

## 2021-08-18 SURGICAL SUPPLY — 13 items
CATH CRD2 QUAD 6FR REP (CATHETERS) ×1 IMPLANT
CATH DECANAV F CURVE (CATHETERS) ×1 IMPLANT
CATH JOSEPH QUAD ALLRED 6F REP (CATHETERS) ×1 IMPLANT
CATH SMTCH THERMOCOOL SF DF (CATHETERS) ×1 IMPLANT
CLOSURE PERCLOSE PROSTYLE (VASCULAR PRODUCTS) ×3 IMPLANT
PACK EP LATEX FREE (CUSTOM PROCEDURE TRAY) ×1
PACK EP LF (CUSTOM PROCEDURE TRAY) ×1 IMPLANT
PAD DEFIB RADIO PHYSIO CONN (PAD) ×2 IMPLANT
PATCH CARTO3 (PAD) ×1 IMPLANT
SHEATH PINNACLE 7F 10CM (SHEATH) ×1 IMPLANT
SHEATH PINNACLE 8F 10CM (SHEATH) ×2 IMPLANT
SHEATH PROBE COVER 6X72 (BAG) ×1 IMPLANT
TUBING SMART ABLATE COOLFLOW (TUBING) ×1 IMPLANT

## 2021-08-18 NOTE — Anesthesia Preprocedure Evaluation (Signed)
Anesthesia Evaluation  ?Patient identified by MRN, date of birth, ID band ?Patient awake ? ? ? ?Reviewed: ?Allergy & Precautions, NPO status , Patient's Chart, lab work & pertinent test results, reviewed documented beta blocker date and time  ? ?History of Anesthesia Complications ?Negative for: history of anesthetic complications ? ?Airway ?Mallampati: II ? ?TM Distance: >3 FB ?Neck ROM: Full ? ? ? Dental ? ?(+) Dental Advisory Given, Teeth Intact ?  ?Pulmonary ?neg shortness of breath, neg COPD, neg recent URI, Current Smoker and Patient abstained from smoking.,  ?  ?breath sounds clear to auscultation ? ? ? ? ? ? Cardiovascular ?+ dysrhythmias Supra Ventricular Tachycardia  ?Rhythm:Regular  ? ?  ?Neuro/Psych ?negative neurological ROS ? negative psych ROS  ? GI/Hepatic ?negative GI ROS, Neg liver ROS,   ?Endo/Other  ?negative endocrine ROS ? Renal/GU ?negative Renal ROS  ? ?  ?Musculoskeletal ?negative musculoskeletal ROS ?(+)  ? Abdominal ?  ?Peds ? Hematology ?negative hematology ROS ?(+)   ?Anesthesia Other Findings ? ? Reproductive/Obstetrics ? ?  ? ? ? ? ? ? ? ? ? ? ? ? ? ?  ?  ? ? ? ? ? ? ? ? ?Anesthesia Physical ?Anesthesia Plan ? ?ASA: 2 ? ?Anesthesia Plan: MAC  ? ?Post-op Pain Management: Minimal or no pain anticipated  ? ?Induction: Intravenous ? ?PONV Risk Score and Plan: 1 and Propofol infusion and Treatment may vary due to age or medical condition ? ?Airway Management Planned: Nasal Cannula ? ?Additional Equipment: None ? ?Intra-op Plan:  ? ?Post-operative Plan:  ? ?Informed Consent: I have reviewed the patients History and Physical, chart, labs and discussed the procedure including the risks, benefits and alternatives for the proposed anesthesia with the patient or authorized representative who has indicated his/her understanding and acceptance.  ? ? ? ?Dental advisory given ? ?Plan Discussed with: CRNA ? ?Anesthesia Plan Comments:   ? ? ? ? ? ? ?Anesthesia Quick  Evaluation ? ?

## 2021-08-18 NOTE — Discharge Instructions (Signed)

## 2021-08-18 NOTE — Anesthesia Postprocedure Evaluation (Signed)
Anesthesia Post Note ? ?Patient: RAZIYAH VANVLECK ? ?Procedure(s) Performed: SVT ABLATION ? ?  ? ?Patient location during evaluation: Cath Lab ?Anesthesia Type: MAC ?Level of consciousness: awake and alert ?Pain management: pain level controlled ?Vital Signs Assessment: post-procedure vital signs reviewed and stable ?Respiratory status: spontaneous breathing, nonlabored ventilation, respiratory function stable and patient connected to nasal cannula oxygen ?Cardiovascular status: stable and blood pressure returned to baseline ?Postop Assessment: no apparent nausea or vomiting ?Anesthetic complications: no ? ? ?No notable events documented. ? ?Last Vitals:  ?Vitals:  ? 08/18/21 1650 08/18/21 1700  ?BP: 104/85 108/75  ?Pulse: 88 77  ?Resp: 14 12  ?Temp:    ?SpO2: 99% 100%  ?  ?Last Pain:  ?Vitals:  ? 08/18/21 1649  ?TempSrc:   ?PainSc: 0-No pain  ? ? ?  ?  ?  ?  ?  ?  ? ?Peri Kreft ? ? ? ? ?

## 2021-08-18 NOTE — H&P (Addendum)
?Electrophysiology Office Note:   ?  ?Date:  08/18/2021  ?  ?ID:  Jeanne David, DOB [birth year], MRN [id number] ?  ?PCP:  Patient, No Pcp Per (Inactive)        ?[facility] Cardiologist:  None  ?[facility] Electrophysiologist:  Vickie Epley, MD  ?  ?Referring MD: Leonie Man, MD  ?  ?Chief Complaint: SVT ?  ?History of Present Illness:   ?  ?Jeanne David is a 41 y.o. female who presents for an evaluation of SVT at the request of Dr. Ellyn Hack. Their medical history includes SVT.  The patient was seen by Dr. Ellyn Hack on June 02, 2021.  The episodes occur during the day and night.  She is able to break the episodes with vagal maneuvers but otherwise they can last hours at a time.  They are debilitating.  She uses atenolol.  She is gone multiple times emergency department. ?She is with her daughter today in clinic. ?She tells me the episodes have become much more frequent recently.  Her first episode when she was apparently 41 years old.  They can last minutes to hours at a time.  Are highly symptomatic.  She feels fatigued during the episode and afterwards.  She calls the episodes "debilitating". ?  ?Presents today for EP study and ablation.  ?  ?Objective  ?  ?    ?Past Medical History:  ?Diagnosis Date  ? Paroxysmal SVT (supraventricular tachycardia) ([place]) 2016  ?  Not currently on any medications.  Takes atenolol prescribed 2 of a family member  ? Renal disorder    ? SVT (supraventricular tachycardia) ([place])    ?  ?  ?     ?Past Surgical History:  ?Procedure Laterality Date  ? None listed      ?  ?  ?Current Medications: ?Active Medications  ?    ?Current Meds  ?Medication Sig  ? flecainide (TAMBOCOR) 50 MG tablet Take 1 tablet (50 mg total) by mouth 2 (two) times daily. Start taking after you have been taking Toprol for 1 week.  ? metoprolol succinate (TOPROL-XL) 50 MG 24 hr tablet Take 1 tablet (50 mg total) by mouth daily. Take with or immediately following a meal.  ?  ?  ?  ?Allergies:    Patient has no known allergies.  ?  ?Social History  ?  ?     ?Socioeconomic History  ? Marital status: Married  ?    Spouse name: Not on file  ? Number of children: Not on file  ? Years of education: Not on file  ? Highest education level: Not on file  ?Occupational History  ? Not on file  ?Tobacco Use  ? Smoking status: Every Day  ?    Packs/day: 1.00  ?    Years: 12.00  ?    Pack years: 12.00  ?    Types: Cigarettes  ? Smokeless tobacco: Never  ?Vaping Use  ? Vaping Use: Every day  ?Substance and Sexual Activity  ? Alcohol use: No  ? Drug use: No  ? Sexual activity: Not on file  ?Other Topics Concern  ? Not on file  ?Social History Narrative  ?  Drinks maybe 1 cup of coffee a day and maybe a half a can of soda.  ?     ?  Does not routinely exercise, but does housework.  ?  ?Social Determinants of Health  ?  ?Financial Resource Strain: Not on file  ?  Food Insecurity: Not on file  ?Transportation Needs: Not on file  ?Physical Activity: Not on file  ?Stress: Not on file  ?Social Connections: Not on file  ?  ?  ?Family History: ?The patient's family history includes COPD in her mother; Hypertension in her father. ?  ?ROS:   ?Please see the history of present illness.    ?All other systems reviewed and are negative. ?  ?EKGs/Labs/Other Studies Reviewed:   ?  ?The following studies were reviewed today: ?  ?06-26-21 echo ?Left ventricular function normal, 55% ?Right ventricular function normal ?Mild MR ?  ?  ?June 15, 2021 ZIO monitor ?28 SVTs, longest lasting 2 minutes and 31 seconds with average rate of 149 bpm.  Symptom triggered events correspond to SVT.  Rare ventricular ectopy. ?  ?  ?EKG:  The ekg ordered today demonstrates sinus rhythm.  No preexcitation. ?  ?  ?Recent Labs: ?Jun 26, 2021: ALT 17; BUN 11; Creatinine, Ser 0.62; Hemoglobin 13.0; Platelets 415; Potassium 4.2; Sodium 139; TSH 0.947  ?Recent Lipid Panel ?Labs (Brief)  ?No results found for: CHOL, TRIG, HDL, CHOLHDL, VLDL, LDLCALC, LDLDIRECT   ? ?  ?Physical Exam:   ?  ?VS:  BP 130/87 (BP Location: Left Arm, Patient Position: Sitting, Cuff Size: Normal)   Pulse 101   Ht 5\' 2"  (1.575 m)   Wt 157 lb (71.2 kg)   SpO2 99%   BMI 28.72 kg/m?    ?  ?   ?Wt Readings from Last 3 Encounters:  ?06/29/21 157 lb (71.2 kg)  ?06/02/21 161 lb 6 oz (73.2 kg)  ?11/14/18 143 lb (64.9 kg)  ?  ?  ?GEN:  Well nourished, well developed in no acute distress ?HEENT: Normal ?NECK: No JVD; No carotid bruits ?LYMPHATICS: No lymphadenopathy ?CARDIAC: RRR, no murmurs, rubs, gallops ?RESPIRATORY:  Clear to auscultation without rales, wheezing or rhonchi  ?ABDOMEN: Soft, non-tender, non-distended ?MUSCULOSKELETAL:  No edema; No deformity  ?SKIN: Warm and dry ?NEUROLOGIC:  Alert and oriented x 3 ?PSYCHIATRIC:  Normal affect  ?  ?  ?  ?  ?Assessment ?  ?  ?ASSESSMENT:   ?  ?1. SVT (supraventricular tachycardia) ([place])   ?2. Pre-op evaluation   ?  ?PLAN:   ?  ?In order of problems listed above: ?  ?#SVT ?Highly symptomatic.  Regular, narrow complex tachycardia.  Recurrent despite treatment with flecainide and metoprolol.  I discussed treatment options including alternative antiarrhythmic drugs and catheter ablation.  I discussed the risks, recovery and likelihood of success with a EP study and ablation.  She would like to proceed with scheduling.  She will hold her flecainide and metoprolol for 5 days prior to the procedure to maximize the chances of tachycardia induction. ?  ?Risk, benefits, and alternatives to EP study and radiofrequency ablation for SVT were also discussed in detail today. These risks include but are not limited to complete heart block, stroke, bleeding, vascular damage, tamponade, perforation, and death. The patient understands these risk and wishes to proceed.  We will therefore proceed with catheter ablation at the next available time.  Carto and ICE and anesthesia are requested for the procedure.   ?  ?  ?  ?  ?Signed, ?Lysbeth Galas T. Quentin Ore, MD, [facility],  [place] ?Electrophysiology ?[place] ?  ?  ?

## 2021-08-18 NOTE — Transfer of Care (Signed)
Immediate Anesthesia Transfer of Care Note ? ?Patient: Jeanne David ? ?Procedure(s) Performed: SVT ABLATION ? ?Patient Location: PACU ? ?Anesthesia Type:MAC ? ?Level of Consciousness: awake, drowsy, patient cooperative and responds to stimulation ? ?Airway & Oxygen Therapy: Patient Spontanous Breathing and Patient connected to nasal cannula oxygen ? ?Post-op Assessment: Report given to RN, Post -op Vital signs reviewed and stable and Patient moving all extremities X 4 ? ?Post vital signs: Reviewed and stable ? ?Last Vitals:  ?Vitals Value Taken Time  ?BP 100/49 08/18/21 1545  ?Temp    ?Pulse 85 08/18/21 1546  ?Resp 15 08/18/21 1546  ?SpO2 100 % 08/18/21 1546  ?Vitals shown include unvalidated device data. ? ?Last Pain:  ?Vitals:  ? 08/18/21 1340  ?TempSrc:   ?PainSc: 0-No pain  ?   ? ?  ? ?Complications: No notable events documented. ?

## 2021-08-19 ENCOUNTER — Encounter: Payer: Self-pay | Admitting: Cardiology

## 2021-08-19 ENCOUNTER — Encounter (HOSPITAL_COMMUNITY): Payer: Self-pay | Admitting: Cardiology

## 2021-08-30 ENCOUNTER — Encounter: Payer: Self-pay | Admitting: Cardiology

## 2021-08-31 NOTE — Telephone Encounter (Signed)
Left message to call back. ? ?Normal to have heart rate fluctuations after an ablation. Continue medications. Your SVT heart rates were well above that number so I do not suspect it has returned.  ? ?Thanks  ?Steffanie Dunn  ?

## 2021-09-21 ENCOUNTER — Encounter: Payer: Self-pay | Admitting: Cardiology

## 2021-09-21 ENCOUNTER — Ambulatory Visit (INDEPENDENT_AMBULATORY_CARE_PROVIDER_SITE_OTHER): Payer: 59 | Admitting: Cardiology

## 2021-09-21 VITALS — BP 118/81 | HR 73 | Ht 62.0 in | Wt 160.0 lb

## 2021-09-21 DIAGNOSIS — Z720 Tobacco use: Secondary | ICD-10-CM

## 2021-09-21 DIAGNOSIS — Z716 Tobacco abuse counseling: Secondary | ICD-10-CM

## 2021-09-21 DIAGNOSIS — I471 Supraventricular tachycardia: Secondary | ICD-10-CM

## 2021-09-21 MED ORDER — METOPROLOL SUCCINATE ER 50 MG PO TB24: 50.0000 mg | ORAL_TABLET | Freq: Every day | ORAL | 3 refills | Status: DC

## 2021-09-21 NOTE — Patient Instructions (Signed)
Medications: Your physician recommends that you continue on your current medications as directed. Please refer to the Current Medication list given to you today. *If you need a refill on your cardiac medications before your next appointment, please call your pharmacy*  Lab Work: None. If you have labs (blood work) drawn today and your tests are completely normal, you will receive your results only by: MyChart Message (if you have MyChart) OR A paper copy in the mail If you have any lab test that is abnormal or we need to change your treatment, we will call you to review the results.  Testing/Procedures: None.  Follow-Up: At [facility], you and your health needs are our priority.  As part of our continuing mission to provide you with exceptional heart care, we have created designated Provider Care Teams.  These Care Teams include your primary Cardiologist (physician) and Advanced Practice Providers (APPs -  Physician Assistants and Nurse Practitioners) who all work together to provide you with the care you need, when you need it.  Your physician wants you to follow-up in: 12 months with one of the following Advanced Practice Providers on your designated Care Team:    Chris Berge, NP Ryan Dunn PA Cadence Furth PA    You will receive a reminder letter in the mail two months in advance. If you don't receive a letter, please call our office to schedule the follow-up appointment.  We recommend signing up for the patient portal called "MyChart".  Sign up information is provided on this After Visit Summary.  MyChart is used to connect with patients for Virtual Visits (Telemedicine).  Patients are able to view lab/test results, encounter notes, upcoming appointments, etc.  Non-urgent messages can be sent to your provider as well.   To learn more about what you can do with MyChart, go to [email or web address].com.    Any Other Special Instructions Will Be Listed Below (If Applicable).  

## 2021-09-21 NOTE — Progress Notes (Signed)
Electrophysiology Office Follow up Visit Note:    Date:  09/21/2021   ID:  Jeanne David, DOB May 27, 1980, MRN [id number]  PCP:  Patient, No Pcp Per (Inactive)  [facility] Cardiologist:  None  [facility] Electrophysiologist:  Lanier Prude, MD    Interval History:    Jeanne David is a 41 y.o. female who presents for a follow up visit.  She underwent an EP study and ablation on August 18, 2021.  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  Accessory pathway was ablated at the coronary sinus ostium with no inducible arrhythmia following the ablation.  Flecainide was stopped. She is done very well since the procedure.  She has had no recurrence since the ablation.  She describes a significant improvement in her quality of life.       Past Medical History:  Diagnosis Date   Paroxysmal SVT (supraventricular tachycardia) (HCC) 2016   Not currently on any medications.  Takes atenolol prescribed 2 of a family member   Renal disorder    SVT (supraventricular tachycardia) (HCC)     Past Surgical History:  Procedure Laterality Date   None listed     SVT ABLATION N/A 08/18/2021   Procedure: SVT ABLATION;  Surgeon: Lanier Prude, MD;  Location: [facility] INVASIVE CV LAB;  Service: Cardiovascular;  Laterality: N/A;    Current Medications: Current Meds  Medication Sig   aspirin EC 325 MG tablet Take 650 mg by mouth every 6 (six) hours as needed for moderate pain.   diphenhydramine-acetaminophen (TYLENOL PM) 25-500 MG TABS tablet Take 3 tablets by mouth at bedtime.   ibuprofen (ADVIL) 200 MG tablet Take 400 mg by mouth every 6 (six) hours as needed for moderate pain.     Allergies:   Patient has no known allergies.   Social History   Socioeconomic History   Marital status: Married    Spouse name: Not on file   Number of children: Not on file   Years of education: Not on file   Highest education level: Not on file  Occupational History   Not on file  Tobacco  Use   Smoking status: Every Day    Packs/day: 1.00    Years: 12.00    Pack years: 12.00    Types: Cigarettes   Smokeless tobacco: Never  Vaping Use   Vaping Use: Every day  Substance and Sexual Activity   Alcohol use: No   Drug use: No   Sexual activity: Not on file  Other Topics Concern   Not on file  Social History Narrative   Drinks maybe 1 cup of coffee a day and maybe a half a can of soda.      Does not routinely exercise, but does housework.   Social Determinants of Health   Financial Resource Strain: Not on file  Food Insecurity: Not on file  Transportation Needs: Not on file  Physical Activity: Not on file  Stress: Not on file  Social Connections: Not on file     Family History: The patient's family history includes COPD in her mother; Hypertension in her father.  ROS:   Please see the history of present illness.    All other systems reviewed and are negative.  EKGs/Labs/Other Studies Reviewed:    The following studies were reviewed today:    EKG:  The ekg ordered today demonstrates sinus rhythm.  Intervals are normal.  Recent Labs: 06/20/2021: ALT 17; TSH 0.947 07/27/2021: BUN 8; Creatinine,  Ser 0.63; Hemoglobin 12.2; Platelets 376; Potassium 4.4; Sodium 139  Recent Lipid Panel No results found for: CHOL, TRIG, HDL, CHOLHDL, VLDL, LDLCALC, LDLDIRECT  Physical Exam:    VS:  BP 118/81   Pulse 73   Ht 5\' 2"  (1.575 m)   Wt 160 lb (72.6 kg)   SpO2 99%   BMI 29.26 kg/m     Wt Readings from Last 3 Encounters:  09/21/21 160 lb (72.6 kg)  08/18/21 155 lb (70.3 kg)  06/29/21 157 lb (71.2 kg)     GEN:  Well nourished, well developed in no acute distress HEENT: Normal NECK: No JVD; No carotid bruits LYMPHATICS: No lymphadenopathy CARDIAC: RRR, no murmurs, rubs, gallops RESPIRATORY:  Clear to auscultation without rales, wheezing or rhonchi  ABDOMEN: Soft, non-tender, non-distended MUSCULOSKELETAL:  No edema; No deformity  SKIN: Warm and  dry NEUROLOGIC:  Alert and oriented x 3 PSYCHIATRIC:  Normal affect        ASSESSMENT:    1. SVT (supraventricular tachycardia) (HCC)   2. PJRT (permanent junctional reciprocating tachycardia) (HCC)    PLAN:    In order of problems listed above:    #SVT #PJ RT No recurrence after her ablation.  Recommend continuing metoprolol long-term.  #Tobacco abuse Long discussion about her tobacco abuse and efforts to stop smoking/vaping.  I have encouraged her to follow-up with her primary care physician to continue discussions about pharmacologic options including Chantix and Wellbutrin.  Follow-up with our clinic in 1 year.  APP appointment okay.     Medication Adjustments/Labs and Tests Ordered: Current medicines are reviewed at length with the patient today.  Concerns regarding medicines are outlined above.  No orders of the defined types were placed in this encounter.  No orders of the defined types were placed in this encounter.    Signed, 08/29/21, MD, [facility], [facility] 09/21/2021 11:58 AM    Electrophysiology [facility]

## 2022-12-13 ENCOUNTER — Other Ambulatory Visit: Payer: Self-pay | Admitting: Cardiology

## 2022-12-13 DIAGNOSIS — I471 Supraventricular tachycardia, unspecified: Secondary | ICD-10-CM

## 2022-12-13 NOTE — Telephone Encounter (Signed)
Hi,   Could you schedule this patient a 12 month follow up visit? The patient was last seen by Dr. Lalla Brothers on 09-21-2021.Thank you so much.

## 2022-12-18 ENCOUNTER — Other Ambulatory Visit: Payer: Self-pay | Admitting: Cardiology

## 2022-12-18 DIAGNOSIS — I471 Supraventricular tachycardia, unspecified: Secondary | ICD-10-CM

## 2022-12-19 ENCOUNTER — Telehealth: Payer: Self-pay | Admitting: Cardiology

## 2022-12-19 DIAGNOSIS — I471 Supraventricular tachycardia, unspecified: Secondary | ICD-10-CM

## 2022-12-19 MED ORDER — METOPROLOL SUCCINATE ER 50 MG PO TB24: 50.0000 mg | ORAL_TABLET | Freq: Every day | ORAL | 0 refills | Status: DC

## 2022-12-19 NOTE — Telephone Encounter (Signed)
*  STAT* If patient is at the pharmacy, call can be transferred to refill team.   1. Which medications need to be refilled? (please list name of each medication and dose if known) metoprolol succinate (TOPROL-XL) 50 MG 24 hr tablet (Expired)   2. Which pharmacy/location (including street and city if local pharmacy) is medication to be sent to? [facility] - [place] ([address]), [place] - [address]    3. Do they need a 30 day or 90 day supply? 90 Day Supply  Pt is scheduled for 09/16, pt is currently out of the medication

## 2022-12-19 NOTE — Telephone Encounter (Addendum)
Requested Prescriptions   Signed Prescriptions Disp Refills   metoprolol succinate (TOPROL-XL) 50 MG 24 hr tablet 30 tablet 0    Sig: Take 1 tablet (50 mg total) by mouth daily. Take with or immediately following a meal.    Authorizing Provider: Lanier Prude    Ordering User: Guerry Minors

## 2022-12-26 ENCOUNTER — Other Ambulatory Visit: Payer: Self-pay

## 2022-12-26 ENCOUNTER — Emergency Department (HOSPITAL_COMMUNITY)
Admission: EM | Admit: 2022-12-26 | Discharge: 2022-12-27 | Disposition: A | Discharge status: Home / Self Care | Payer: BLUE CROSS/BLUE SHIELD | Attending: Emergency Medicine | Admitting: Emergency Medicine

## 2022-12-26 ENCOUNTER — Encounter (HOSPITAL_COMMUNITY): Payer: Self-pay

## 2022-12-26 DIAGNOSIS — N10 Acute tubulo-interstitial nephritis: Secondary | ICD-10-CM | POA: Diagnosis not present

## 2022-12-26 DIAGNOSIS — R109 Unspecified abdominal pain: Secondary | ICD-10-CM | POA: Diagnosis present

## 2022-12-26 NOTE — ED Triage Notes (Signed)
Pt c/o L abdominal/back pain that started at 630 tonight. Pt states that she was diagnosed with kidney stones x1 month ago and has not been able to get appointment with urology. Pt states she can not take pain anymore.

## 2022-12-27 ENCOUNTER — Emergency Department (HOSPITAL_COMMUNITY): Payer: BLUE CROSS/BLUE SHIELD

## 2022-12-27 LAB — COMPREHENSIVE METABOLIC PANEL
ALT: 21 U/L (ref 0–44)
AST: 18 U/L (ref 15–41)
Albumin: 4 g/dL (ref 3.5–5.0)
Alkaline Phosphatase: 74 U/L (ref 38–126)
Anion gap: 9 (ref 5–15)
BUN: 15 mg/dL (ref 6–20)
CO2: 23 mmol/L (ref 22–32)
Calcium: 9 mg/dL (ref 8.9–10.3)
Chloride: 104 mmol/L (ref 98–111)
Creatinine, Ser: 0.7 mg/dL (ref 0.44–1.00)
GFR, Estimated: >60 mL/min (ref >60)
Glucose, Bld: 127 mg/dL — ABNORMAL HIGH (ref 70–99)
Potassium: 3.9 mmol/L (ref 3.5–5.1)
Sodium: 136 mmol/L (ref 135–145)
Total Bilirubin: 0.6 mg/dL (ref 0.3–1.2)
Total Protein: 7.6 g/dL (ref 6.5–8.1)

## 2022-12-27 LAB — CBC
HCT: 39.1 % (ref 36.0–46.0)
Hemoglobin: 12.6 g/dL (ref 12.0–15.0)
MCH: 25.6 pg — ABNORMAL LOW (ref 26.0–34.0)
MCHC: 32.2 g/dL (ref 30.0–36.0)
MCV: 79.3 fL — ABNORMAL LOW (ref 80.0–100.0)
Platelets: 386 10*3/uL (ref 150–400)
RBC: 4.93 MIL/uL (ref 3.87–5.11)
RDW: 14.2 % (ref 11.5–15.5)
WBC: 11.4 10*3/uL — ABNORMAL HIGH (ref 4.0–10.5)
nRBC: 0 % (ref 0.0–0.2)

## 2022-12-27 LAB — URINALYSIS, ROUTINE W REFLEX MICROSCOPIC
Bilirubin Urine: NEGATIVE
Glucose, UA: NEGATIVE mg/dL
Ketones, ur: NEGATIVE mg/dL
Nitrite: NEGATIVE
Protein, ur: 30 mg/dL — AB
Specific Gravity, Urine: 1.02 (ref 1.005–1.030)
pH: 6 (ref 5.0–8.0)

## 2022-12-27 LAB — URINALYSIS, MICROSCOPIC (REFLEX)

## 2022-12-27 LAB — LIPASE, BLOOD: Lipase: 28 U/L (ref 11–51)

## 2022-12-27 LAB — POC URINE PREG, ED: Preg Test, Ur: NEGATIVE

## 2022-12-27 MED ORDER — ONDANSETRON HCL 4 MG/2ML IJ SOLN: 4.0000 mg | Freq: Once | INTRAMUSCULAR | Status: DC

## 2022-12-27 MED ORDER — HYDROMORPHONE HCL 1 MG/ML IJ SOLN: 1.0000 mg | Freq: Once | INTRAMUSCULAR | Status: DC

## 2022-12-27 MED ORDER — TAMSULOSIN HCL 0.4 MG PO CAPS
0.4000 mg | ORAL_CAPSULE | Freq: Once | ORAL | Status: AC
  Administered 2022-12-27: 0.4 mg via ORAL
  Filled 2022-12-27: qty 1

## 2022-12-27 MED ORDER — ONDANSETRON 4 MG PO TBDP: ORAL_TABLET | ORAL | 0 refills | Status: AC

## 2022-12-27 MED ORDER — CEPHALEXIN 500 MG PO CAPS
1000.0000 mg | ORAL_CAPSULE | Freq: Once | ORAL | Status: AC
  Administered 2022-12-27: 1000 mg via ORAL
  Filled 2022-12-27: qty 2

## 2022-12-27 MED ORDER — KETOROLAC TROMETHAMINE 60 MG/2ML IM SOLN
30.0000 mg | Freq: Once | INTRAMUSCULAR | Status: AC
  Administered 2022-12-27: 30 mg via INTRAMUSCULAR
  Filled 2022-12-27: qty 2

## 2022-12-27 MED ORDER — OXYCODONE-ACETAMINOPHEN 5-325 MG PO TABS: 1.0000 | ORAL_TABLET | Freq: Three times a day (TID) | ORAL | 0 refills | Status: DC | PRN

## 2022-12-27 MED ORDER — OXYCODONE-ACETAMINOPHEN 5-325 MG PO TABS
2.0000 | ORAL_TABLET | Freq: Once | ORAL | Status: AC
  Administered 2022-12-27: 1 via ORAL
  Filled 2022-12-27: qty 2

## 2022-12-27 MED ORDER — ONDANSETRON 8 MG PO TBDP
8.0000 mg | ORAL_TABLET | Freq: Once | ORAL | Status: AC
  Administered 2022-12-27: 8 mg via ORAL
  Filled 2022-12-27: qty 1

## 2022-12-27 MED ORDER — LACTATED RINGERS IV BOLUS: 1000.0000 mL | Freq: Once | INTRAVENOUS | Status: DC

## 2022-12-27 MED ORDER — CEPHALEXIN 500 MG PO CAPS: 500.0000 mg | ORAL_CAPSULE | Freq: Four times a day (QID) | ORAL | 0 refills | Status: DC

## 2022-12-27 NOTE — ED Notes (Signed)
Discharge instructions provided by edp were discussed with pt. Pt verbalized understanding with no additional questions at this time. Pt to go home with s/o at bedside  

## 2022-12-27 NOTE — ED Notes (Signed)
Pt tearful on arrival to room. Requesting to see the doctor, asking for a CT scan, requesting water, and something for pain. Informed provider would need to see her to order pain medication. S/o at bedside.

## 2022-12-27 NOTE — ED Notes (Signed)
Pt sts she is unable to provide urine at this time

## 2022-12-27 NOTE — ED Notes (Signed)
Patient transported to CT 

## 2022-12-27 NOTE — ED Provider Notes (Signed)
[place] EMERGENCY DEPARTMENT AT [facility] Provider Note   CSN: [id number] Arrival date & time: 12/26/22  2255     History  Chief Complaint  Patient presents with   Flank Pain    Jeanne David is a 42 y.o. female.  42 year old female who presents ER today with left greater than right flank pain.  Patient states it been going on for few days but got a lot worse tonight.  She has been trying to get an appoint with urology but she has been having issues with referrals not being done correctly, not being received and also insurance issues.  She thinks she is getting it figured out but the pain has became unbearable and all she had at home was tramadol.  She states she has a history of kidney stones and she thinks that is probably what it is.  Denies any fever but did have some nausea/vomiting with the pain.  Does have some discolored urine as well and decreased frequency.  Maybe some mild dysuria.  No traumatic injuries.  She did have an x-ray done with [facility] however I does not crossover and she does not have the disc which is part of the issue with her again her referral as well.   Flank Pain       Home Medications Prior to Admission medications   Medication Sig Start Date End Date Taking? Authorizing Provider  cephALEXin (KEFLEX) 500 MG capsule Take 1 capsule (500 mg total) by mouth 4 (four) times daily. 12/27/22  Yes Nuriyah Hanline, Barbara Cower, MD  ondansetron (ZOFRAN-ODT) 4 MG disintegrating tablet 4mg  ODT q4 hours prn nausea/vomit 12/27/22  Yes Denee Boeder, Barbara Cower, MD  oxyCODONE-acetaminophen (PERCOCET) 5-325 MG tablet Take 1-2 tablets by mouth every 8 (eight) hours as needed. 12/27/22  Yes Jasani Dolney, Barbara Cower, MD  aspirin EC 325 MG tablet Take 650 mg by mouth every 6 (six) hours as needed for moderate pain.    [provider]  diphenhydramine-acetaminophen (TYLENOL PM) 25-500 MG TABS tablet Take 3 tablets by mouth at bedtime.    [provider]  ibuprofen  (ADVIL) 200 MG tablet Take 400 mg by mouth every 6 (six) hours as needed for moderate pain.    [provider]  metoprolol succinate (TOPROL-XL) 50 MG 24 hr tablet Take 1 tablet (50 mg total) by mouth daily. Take with or immediately following a meal. 12/19/22 12/14/23  Lanier Prude, MD      Allergies    Patient has no known allergies.    Review of Systems   Review of Systems  Genitourinary:  Positive for flank pain.    Physical Exam Updated Vital Signs BP 120/78   Pulse 93   Temp 98.4 F (36.9 C) (Oral)   Resp 18   SpO2 93%  Physical Exam Vitals and nursing note reviewed.  Constitutional:      Appearance: She is well-developed.  HENT:     Head: Normocephalic and atraumatic.  Eyes:     Pupils: Pupils are equal, round, and reactive to light.  Cardiovascular:     Rate and Rhythm: Normal rate and regular rhythm.  Pulmonary:     Effort: No respiratory distress.     Breath sounds: No stridor.  Abdominal:     General: There is no distension.  Musculoskeletal:        General: No swelling or tenderness. Normal range of motion.     Cervical back: Normal range of motion.     Comments: Mild  right CVA tenderness moderate left CVA tenderness.  No rash.  No obvious traumatic injuries.  Skin:    General: Skin is warm and dry.  Neurological:     General: No focal deficit present.     Mental Status: She is alert.     ED Results / Procedures / Treatments   Labs (all labs ordered are listed, but only abnormal results are displayed) Labs Reviewed  COMPREHENSIVE METABOLIC PANEL - Abnormal; Notable for the following components:      Result Value   Glucose, Bld 127 (*)    All other components within normal limits  CBC - Abnormal; Notable for the following components:   WBC 11.4 (*)    MCV 79.3 (*)    MCH 25.6 (*)    All other components within normal limits  URINALYSIS, ROUTINE W REFLEX MICROSCOPIC - Abnormal; Notable for the following components:   Hgb urine  dipstick LARGE (*)    Protein, ur 30 (*)    Leukocytes,Ua SMALL (*)    All other components within normal limits  URINALYSIS, MICROSCOPIC (REFLEX) - Abnormal; Notable for the following components:   Bacteria, UA FEW (*)    All other components within normal limits  LIPASE, BLOOD  POC URINE PREG, ED    EKG None  Radiology CT Renal Stone Study  Result Date: 12/27/2022 CLINICAL DATA:  Bilateral sided back pain. EXAM: CT ABDOMEN AND PELVIS WITHOUT CONTRAST TECHNIQUE: Multidetector CT imaging of the abdomen and pelvis was performed following the standard protocol without IV contrast. RADIATION DOSE REDUCTION: This exam was performed according to the departmental dose-optimization program which includes automated exposure control, adjustment of the mA and/or kV according to patient size and/or use of iterative reconstruction technique. COMPARISON:  November 14, 2018 FINDINGS: Lower chest: No acute abnormality. Hepatobiliary: No focal liver abnormality is seen. No gallstones, gallbladder wall thickening, or biliary dilatation. Pancreas: Unremarkable. No pancreatic ductal dilatation or surrounding inflammatory changes. Spleen: Normal in size without focal abnormality. Adrenals/Urinary Tract: Adrenal glands are unremarkable. Kidneys are normal in size, without focal lesions. A cluster of 3 mm, 6 mm and 7 mm nonobstructing renal calculi are seen within the lower pole of the right kidney a 12 mm renal calculus is seen within the right renal pelvis. A 5 mm nonobstructing renal calculus is seen within the mid to lower left kidney with a 12 mm renal calculus noted within the left renal pelvis. There is mild proximal left ureteral and bilateral peripelvic inflammatory fat stranding. Bladder is unremarkable. Stomach/Bowel: Stomach is within normal limits. Appendix appears normal. No evidence of bowel wall thickening, distention, or inflammatory changes. Vascular/Lymphatic: No significant vascular findings are present.  No enlarged abdominal or pelvic lymph nodes. Reproductive: Uterus and bilateral adnexa are unremarkable. Other: No abdominal wall hernia or abnormality. No abdominopelvic ascites. Musculoskeletal: No acute or significant osseous findings. IMPRESSION: 1. Multiple bilateral nonobstructing renal calculi, as described above. 2. Mild proximal left ureteral and bilateral peripelvic inflammatory fat stranding which may be secondary to a recently passed stone. Sequelae associated with acute pyelonephritis cannot be excluded. Electronically Signed   By: Aram Candela M.D.   On: 12/27/2022 01:59    Procedures Procedures    Medications Ordered in ED Medications  oxyCODONE-acetaminophen (PERCOCET/ROXICET) 5-325 MG per tablet 2 tablet (1 tablet Oral Given 12/27/22 0210)  ketorolac (TORADOL) injection 30 mg (30 mg Intramuscular Given 12/27/22 0213)  cephALEXin (KEFLEX) capsule 1,000 mg (1,000 mg Oral Given 12/27/22 0212)  tamsulosin (FLOMAX) capsule 0.4 mg (  0.4 mg Oral Given 12/27/22 0210)  ondansetron (ZOFRAN-ODT) disintegrating tablet 8 mg (8 mg Oral Given 12/27/22 0213)    ED Course/ Medical Decision Making/ A&P                                 Medical Decision Making Amount and/or Complexity of Data Reviewed Labs: ordered. Radiology: ordered.  Risk Prescription drug management.   Discussed with her that I was unsure if this was kidney stone versus anything else and x-ray was not as helpful as a CT scan.  Initially did decided to use IV fluids, pain medicine while waiting for the rest of her labs and get a CT scan to evaluate.  Patient ultimately changed her mind and wanted to do oral medications.  Urinalysis was consistent with likely UTI, culture added.  My interpretation of CT scan I note multiple stones in bilateral kidneys.  Radiology read reviewed noting possible stranding around the left kidney and ureter consistent with recently passed stone versus pyelonephritis.  Patient states that she has  not noticed a stone passing and she still having pain so I suspect this is more pyelonephritis.  Will treat with antibiotics for 10 days with urology follow-up, referral placed.  Patient's pain ultimately improved quite a bit did not appear acutely ill or septic requiring further workup, treatment or hospitalization.  There is no obstructing stone to require emergent urology consultation.  Discussed reasons to return to the emergency room.  She was thankful for our help.  Final Clinical Impression(s) / ED Diagnoses Final diagnoses:  Flank pain  Acute pyelonephritis    Rx / DC Orders ED Discharge Orders          Ordered    cephALEXin (KEFLEX) 500 MG capsule  4 times daily        12/27/22 0243    oxyCODONE-acetaminophen (PERCOCET) 5-325 MG tablet  Every 8 hours PRN        12/27/22 0243    Ambulatory referral to Urology        12/27/22 0243    ondansetron (ZOFRAN-ODT) 4 MG disintegrating tablet        12/27/22 0246              Dannell Gortney, Barbara Cower, MD 12/27/22 2254

## 2022-12-27 NOTE — ED Notes (Signed)
Pt sts she is refusing IV at this time as her husband at bedside needs to be at work earlier today and has not had any sleep. Requesting alterative pain medication. Also request referral to [facility].

## 2023-01-02 ENCOUNTER — Telehealth: Payer: Self-pay | Admitting: Cardiology

## 2023-01-02 DIAGNOSIS — I471 Supraventricular tachycardia, unspecified: Secondary | ICD-10-CM

## 2023-01-02 MED ORDER — METOPROLOL SUCCINATE ER 50 MG PO TB24: 50.0000 mg | ORAL_TABLET | Freq: Every day | ORAL | 0 refills | Status: DC

## 2023-01-02 NOTE — Telephone Encounter (Signed)
*  STAT* If patient is at the pharmacy, call can be transferred to refill team.   1. Which medications need to be refilled? (please list name of each medication and dose if known) metoprolol succinate (TOPROL-XL) 50 MG 24 hr tablet  2. Which pharmacy/location (including street and city if local pharmacy) is medication to be sent to? [facility] - [place] ([address]), [place] - [address]  3. Do they need a 30 day or 90 day supply?  90 day supply

## 2023-01-15 ENCOUNTER — Ambulatory Visit: Payer: Self-pay | Admitting: Cardiology

## 2023-01-18 ENCOUNTER — Telehealth: Payer: Self-pay | Admitting: Cardiology

## 2023-01-18 NOTE — Telephone Encounter (Signed)
Disp Refills Start End   metoprolol succinate (TOPROL-XL) 50 MG 24 hr tablet 90 tablet 0 01/02/2023 12/28/2023   Sig - Route: Take 1 tablet (50 mg total) by mouth daily. Take with or immediately following a meal. - Oral   Sent to pharmacy as: metoprolol succinate (TOPROL-XL) 50 MG 24 hr tablet   E-Prescribing Status: Receipt confirmed by pharmacy (01/02/2023 12:43 PM EDT)

## 2023-01-18 NOTE — Telephone Encounter (Signed)
*  STAT* If patient is at the pharmacy, call can be transferred to refill team.   1. Which medications need to be refilled? (please list name of each medication and dose if known)   metoprolol succinate (TOPROL-XL) 50 MG 24 hr tablet     2. Would you like to learn more about the convenience, safety, & potential cost savings by using the [facility]? No   3. Are you open to using the [facility] No   4. Which pharmacy/location (including street and city if local pharmacy) is medication to be sent to?[facility] - [place] ([address]), [place] - [address]    5. Do they need a 30 day or 90 day supply? 90 Day supply

## 2023-01-24 ENCOUNTER — Other Ambulatory Visit: Payer: Self-pay | Admitting: Urology

## 2023-01-24 ENCOUNTER — Encounter (HOSPITAL_BASED_OUTPATIENT_CLINIC_OR_DEPARTMENT_OTHER): Payer: Self-pay | Admitting: Urology

## 2023-01-24 NOTE — Progress Notes (Signed)
Spoke w/ via phone for pre-op interview--- pt Lab needs dos----   EKG,  urine preg      Lab results------ no COVID test -----patient states asymptomatic no test needed Arrive at ------- 1245 on 01-26-2023 NPO after MN NO Solid Food.  Clear liquids from MN until--- 1045 Med rec completed Medications to take morning of surgery ----- toprol,  if needed may take oxycodone/ zofran Diabetic medication ----- n/a Patient instructed no nail polish to be worn day of surgery Patient instructed to bring photo id and insurance card day of surgery Patient aware to have Driver (ride ) / caregiver    for 24 hours after surgery - husband, Iantha Fallen Patient Special Instructions ----- n/a Pre-Op special Instructions ----- n/a Patient verbalized understanding of instructions that were given at this phone interview. Patient denies chest pain, sob, fever, cough at the interview.   Anesthesia;  SVT s/p ablation 08-18-2021.  EP dr Lalla Brothers , Theron Arista 09-21-2021, pt had to reschedule follow up appointment due to kidney stones, on 03-21-2023.  Pt denies s&s SVT and toprol as prescribed.

## 2023-01-26 ENCOUNTER — Ambulatory Visit (HOSPITAL_BASED_OUTPATIENT_CLINIC_OR_DEPARTMENT_OTHER): Admission: RE | Admit: 2023-01-26 | Payer: BLUE CROSS/BLUE SHIELD | Source: Home / Self Care | Admitting: Urology

## 2023-01-26 DIAGNOSIS — Z01818 Encounter for other preprocedural examination: Secondary | ICD-10-CM

## 2023-01-26 HISTORY — DX: Calculus of kidney: N20.0

## 2023-01-26 HISTORY — DX: Unspecified abdominal pain: R10.9

## 2023-01-26 HISTORY — DX: Personal history of urinary calculi: Z87.442

## 2023-01-26 SURGERY — CYSTOSCOPY, WITH STENT INSERTION
Anesthesia: General | Laterality: Bilateral

## 2023-02-19 ENCOUNTER — Encounter (HOSPITAL_BASED_OUTPATIENT_CLINIC_OR_DEPARTMENT_OTHER): Payer: Self-pay

## 2023-02-19 ENCOUNTER — Ambulatory Visit (HOSPITAL_BASED_OUTPATIENT_CLINIC_OR_DEPARTMENT_OTHER): Admit: 2023-02-19 | Payer: BLUE CROSS/BLUE SHIELD | Admitting: Urology

## 2023-02-19 SURGERY — CYSTOSCOPY/URETEROSCOPY/HOLMIUM LASER/STENT PLACEMENT
Anesthesia: General | Laterality: Bilateral

## 2023-03-05 ENCOUNTER — Ambulatory Visit (HOSPITAL_BASED_OUTPATIENT_CLINIC_OR_DEPARTMENT_OTHER): Admit: 2023-03-05 | Payer: BLUE CROSS/BLUE SHIELD | Admitting: Urology

## 2023-03-05 ENCOUNTER — Encounter (HOSPITAL_BASED_OUTPATIENT_CLINIC_OR_DEPARTMENT_OTHER): Payer: Self-pay

## 2023-03-05 SURGERY — CYSTOSCOPY/URETEROSCOPY/HOLMIUM LASER/STENT PLACEMENT
Anesthesia: General | Laterality: Bilateral

## 2023-03-21 ENCOUNTER — Ambulatory Visit: Payer: BLUE CROSS/BLUE SHIELD | Admitting: Cardiology

## 2023-04-02 ENCOUNTER — Telehealth: Payer: Self-pay | Admitting: Cardiology

## 2023-04-02 DIAGNOSIS — I471 Supraventricular tachycardia, unspecified: Secondary | ICD-10-CM

## 2023-04-02 DIAGNOSIS — Z01818 Encounter for other preprocedural examination: Secondary | ICD-10-CM

## 2023-04-02 MED ORDER — METOPROLOL SUCCINATE ER 50 MG PO TB24: 50.0000 mg | ORAL_TABLET | Freq: Every day | ORAL | 0 refills | Status: AC

## 2023-04-02 NOTE — Telephone Encounter (Signed)
Patient is calling requesting a referral be sent to [facility] due to her insurance being OON with our office. The fax number for their practice is [phone]. She is requesting it be made urgent due to needing cardiac clearance for 01/06 procedure. Please advise.

## 2023-04-02 NOTE — Telephone Encounter (Signed)
last visit 09/21/21 with plan to follow up in 12 months  next visit:  none   There is a phone note regarding cardiology provider.

## 2023-04-02 NOTE — Telephone Encounter (Signed)
*  STAT* If patient is at the pharmacy, call can be transferred to refill team.   1. Which medications need to be refilled? (please list name of each medication and dose if known)   metoprolol succinate (TOPROL-XL) 50 MG 24 hr tablet    2. Would you like to learn more about the convenience, safety, & potential cost savings by using the [facility]? N/A    3. Are you open to using the [facility] ([facility]. N/A   4. Which pharmacy/location (including street and city if local pharmacy) is medication to be sent to?  [facility] - [place] ([place]),  - [address]     5. Do they need a 30 day or 90 day supply? 30 day supply  To last her until reestablished with new cardiologist.

## 2023-04-03 NOTE — Telephone Encounter (Signed)
Referral has been placed. Left message for patient with this information. Advised to call  back with any questions.
